# Patient Record
Sex: Male | Born: 1990 | Race: Black or African American | Hispanic: No | Marital: Single | Smoking: Light tobacco smoker
Health system: Southern US, Community
[De-identification: ages and names within clinical notes are randomized; demographics above are authoritative.]

## PROBLEM LIST (undated history)

## (undated) DIAGNOSIS — R011 Cardiac murmur, unspecified: Secondary | ICD-10-CM

## (undated) HISTORY — DX: Cardiac murmur, unspecified: R01.1

---
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
## 2013-01-08 ENCOUNTER — Emergency Department: Payer: Self-pay | Admitting: Emergency Medicine

## 2013-01-08 LAB — COMPREHENSIVE METABOLIC PANEL
Anion Gap: 7 (ref 7–16)
Bilirubin,Total: 0.3 mg/dL (ref 0.2–1.0)
Calcium, Total: 8.8 mg/dL (ref 8.5–10.1)
Co2: 27 mmol/L (ref 21–32)
Creatinine: 1.23 mg/dL (ref 0.60–1.30)
EGFR (African American): >60
EGFR (Non-African Amer.): >60
Osmolality: 281 (ref 275–301)
SGOT(AST): 27 U/L (ref 15–37)
Sodium: 141 mmol/L (ref 136–145)
Total Protein: 7.7 g/dL (ref 6.4–8.2)

## 2013-01-08 LAB — URINALYSIS, COMPLETE
Bilirubin,UR: NEGATIVE
Glucose,UR: NEGATIVE mg/dL (ref 0–75)
Ketone: NEGATIVE
Nitrite: NEGATIVE
RBC,UR: 1 /HPF (ref 0–5)
Squamous Epithelial: 1

## 2013-01-08 LAB — CBC
HCT: 39.1 % — ABNORMAL LOW (ref 40.0–52.0)
MCH: 25.6 pg — ABNORMAL LOW (ref 26.0–34.0)
MCHC: 33.2 g/dL (ref 32.0–36.0)
RBC: 5.08 10*6/uL (ref 4.40–5.90)
WBC: 8.2 10*3/uL (ref 3.8–10.6)

## 2013-01-10 LAB — URINE CULTURE

## 2017-05-03 ENCOUNTER — Emergency Department: Payer: Self-pay

## 2017-05-03 ENCOUNTER — Emergency Department
Admission: EM | Admit: 2017-05-03 | Discharge: 2017-05-03 | Disposition: A | Discharge status: Home / Self Care | Payer: Self-pay | Attending: Emergency Medicine | Admitting: Emergency Medicine

## 2017-05-03 DIAGNOSIS — Y998 Other external cause status: Secondary | ICD-10-CM | POA: Insufficient documentation

## 2017-05-03 DIAGNOSIS — Y33XXXA Other specified events, undetermined intent, initial encounter: Secondary | ICD-10-CM | POA: Insufficient documentation

## 2017-05-03 DIAGNOSIS — Y9302 Activity, running: Secondary | ICD-10-CM | POA: Insufficient documentation

## 2017-05-03 DIAGNOSIS — S92354A Nondisplaced fracture of fifth metatarsal bone, right foot, initial encounter for closed fracture: Secondary | ICD-10-CM | POA: Insufficient documentation

## 2017-05-03 DIAGNOSIS — Y929 Unspecified place or not applicable: Secondary | ICD-10-CM | POA: Insufficient documentation

## 2017-05-03 DIAGNOSIS — S92351A Displaced fracture of fifth metatarsal bone, right foot, initial encounter for closed fracture: Secondary | ICD-10-CM

## 2017-05-03 MED ORDER — HYDROCODONE-ACETAMINOPHEN 5-325 MG PO TABS: 1.0000 | ORAL_TABLET | Freq: Four times a day (QID) | ORAL | 0 refills | Status: DC | PRN

## 2017-05-03 NOTE — ED Triage Notes (Signed)
Pt states was jogging yesterday afternoon and felt a sharp stinging pain on the top outside of right foot. Pt state states hurts to put any pressure on right foot. Pt states iced foot and took ibuprofen but did not ease pain.

## 2017-05-03 NOTE — ED Provider Notes (Signed)
[facility] Emergency Department Provider Note   ____________________________________________   First MD Initiated Contact with Patient 05/03/17 1354     (approximate)  I have reviewed the triage vital signs and the nursing notes.   HISTORY  Chief Complaint Foot Pain   HPI Eric Ryan is a 26 y.o. male is complaining of right foot pain. Patient states that he was jogging yesterday afternoon and felt sharp pain on the outside portion of his right foot. Patient has iced his foot and take ibuprofen without much relief of pain. To his pain as 10 over 10.  History reviewed. No pertinent past medical history.  There are no active problems to display for this patient.   History reviewed. No pertinent surgical history.  Prior to Admission medications   Medication Sig Start Date End Date Taking? Authorizing Provider  HYDROcodone-acetaminophen (NORCO/VICODIN) 5-325 MG tablet Take 1 tablet by mouth every 6 (six) hours as needed for moderate pain. 05/03/17   Tommi Rumps, PA-C    Allergies Patient has no known allergies.  History reviewed. No pertinent family history.  Social History Social History  Substance Use Topics  . Smoking status: Never Smoker  . Smokeless tobacco: Never Used  . Alcohol use No    Review of Systems Constitutional: No fever/chills Cardiovascular: Denies chest pain. Respiratory: Denies shortness of breath. Gastrointestinal: No abdominal pain.  No nausea, no vomiting. Musculoskeletal: positive pain right foot. Skin: Negative for rash. Neurological: Negative for  focal weakness or numbness. ____________________________________________   PHYSICAL EXAM:  VITAL SIGNS: ED Triage Vitals  Enc Vitals Group     BP 05/03/17 1352 (!) 140/115     Pulse Rate 05/03/17 1352 (!) 105     Resp 05/03/17 1352 16     Temp 05/03/17 1352 99 F (37.2 C)     Temp Source 05/03/17 1352 Oral     SpO2 05/03/17 1352 98 %     Weight  05/03/17 1347 290 lb (131.5 kg)     Height 05/03/17 1347 6\' 2"  (1.88 m)     Head Circumference --      Peak Flow --      Pain Score 05/03/17 1346 10     Pain Loc --      Pain Edu? --      Excl. in GC? --    Constitutional: Alert and oriented. Well appearing and in no acute distress. Eyes: Conjunctivae are normal.  Head: Atraumatic. Neck: No stridor.   Cardiovascular: Normal rate, regular rhythm. Grossly normal heart sounds.  Good peripheral circulation. Respiratory: Normal respiratory effort.  No retractions. Lungs CTAB. Musculoskeletal: examination of the right foot there is moderate soft tissue swelling at the base of the  fifth metatarsal. Skin is intact. There is sensory function intact. Capillary refill is less than 3 seconds. No gross deformity is noted. Neurologic:  Normal speech and language. No gross focal neurologic deficits are appreciated.  Skin:  Skin is warm, dry and intact.  Psychiatric: Mood and affect are normal. Speech and behavior are normal.  ____________________________________________   LABS (all labs ordered are listed, but only abnormal results are displayed)  Labs Reviewed - No data to display   RADIOLOGY  Dg Foot Complete Right  Result Date: 05/03/2017 CLINICAL DATA:  Lateral foot pain after running yesterday. EXAM: RIGHT FOOT COMPLETE - 3+ VIEW COMPARISON:  None. FINDINGS: A nondisplaced transverse fracture is present at the base of the fifth metatarsal. Joints are located. No additional fractures  are present. IMPRESSION: Nondisplaced fracture the base of the fifth metatarsal. Electronically Signed   By: Marin Robertshristopher  Mattern M.D.   On: 05/03/2017 14:48    ____________________________________________   PROCEDURES  Procedure(s) performed: None  Procedures  Critical Care performed: No  ____________________________________________   INITIAL IMPRESSION / ASSESSMENT AND PLAN / ED COURSE  X-ray right foot confirm that patient has a fracture of  his fifth metatarsal base. Patient was given a wooden shoe, a prescription for Norco and ibuprofen and referral to Dr. Alberteen Spindleline who is the podiatrist on-call.   ____________________________________________   FINAL CLINICAL IMPRESSION(S) / ED DIAGNOSES  Final diagnoses:  Closed fracture of base of fifth metatarsal bone of right foot, initial encounter      NEW MEDICATIONS STARTED DURING THIS VISIT:  Discharge Medication List as of 05/03/2017  3:34 PM    START taking these medications   Details  HYDROcodone-acetaminophen (NORCO/VICODIN) 5-325 MG tablet Take 1 tablet by mouth every 6 (six) hours as needed for moderate pain., Starting Mon 05/03/2017, Print         Note:  This document was prepared using Dragon voice recognition software and may include unintentional dictation errors.    Tommi RumpsSummers, Rhonda L, PA-C 05/03/17 1548    Arnaldo NatalMalinda, Paul F, MD 05/15/17 [id number]

## 2017-05-03 NOTE — Discharge Instructions (Signed)
To follow-up with Dr. Alberteen Spindleline  who is [facility] and elevate as needed for swelling and pain. Begin taking Norco one every 6 hours as needed for pain. He may also continue taking ibuprofen 2 tablets 3 times a day with food. Wear  wooden shoe for support. These fractures generally takes between 4 and 6 weeks to heal. Also your  blood pressure was elevated today. Make an appointment with your primary care provider to have your blood pressure rechecked. Blood pressure today was 140/115.

## 2017-06-29 ENCOUNTER — Encounter: Payer: Self-pay | Admitting: Emergency Medicine

## 2017-06-29 ENCOUNTER — Emergency Department: Payer: Self-pay

## 2017-06-29 ENCOUNTER — Emergency Department
Admission: EM | Admit: 2017-06-29 | Discharge: 2017-06-29 | Disposition: A | Discharge status: Home / Self Care | Payer: Self-pay | Attending: Student in an Organized Health Care Education/Training Program | Admitting: Student in an Organized Health Care Education/Training Program

## 2017-06-29 ENCOUNTER — Other Ambulatory Visit: Payer: Self-pay

## 2017-06-29 DIAGNOSIS — A084 Viral intestinal infection, unspecified: Secondary | ICD-10-CM | POA: Insufficient documentation

## 2017-06-29 DIAGNOSIS — R509 Fever, unspecified: Secondary | ICD-10-CM | POA: Insufficient documentation

## 2017-06-29 DIAGNOSIS — F17200 Nicotine dependence, unspecified, uncomplicated: Secondary | ICD-10-CM | POA: Insufficient documentation

## 2017-06-29 NOTE — ED Provider Notes (Signed)
[facility] Emergency Department Provider Note  ____________________________________________  Time seen: Approximately 6:38 PM  I have reviewed the triage vital signs and the nursing notes.   HISTORY  Chief Complaint Fever and Cough    HPI Eric Ryan is a 26 y.o. male presents to the emergency department with no acute symptoms.  Patient reports that one day ago, he experienced 5 episodes of vomiting and diarrhea.  He denies abdominal pain, congestion, rhinorrhea, headache or myalgias.  Patient reports cough for the past 4 days and low-grade fever.  Patient reports that he needs a work note for yesterday.  He denies chest pain, chest tightness and current nausea.  Patient has no history of essential hypertension.   History reviewed. No pertinent past medical history.  There are no active problems to display for this patient.   History reviewed. No pertinent surgical history.  Prior to Admission medications   Medication Sig Start Date End Date Taking? Authorizing Provider  HYDROcodone-acetaminophen (NORCO/VICODIN) 5-325 MG tablet Take 1 tablet by mouth every 6 (six) hours as needed for moderate pain. 05/03/17   Tommi RumpsSummers, Rhonda L, PA-C    Allergies Patient has no known allergies.  No family history on file.  Social History Social History   Tobacco Use  . Smoking status: Light Tobacco Smoker  . Smokeless tobacco: Never Used  Substance Use Topics  . Alcohol use: No  . Drug use: No     Review of Systems  Constitutional: No fever/chills Eyes: No visual changes. No discharge ENT: No upper respiratory complaints. Cardiovascular: no chest pain. Respiratory: no cough. No SOB. Gastrointestinal: No abdominal pain.  No nausea, no vomiting.  No diarrhea.  No constipation. Musculoskeletal: Negative for musculoskeletal pain. Skin: Negative for rash, abrasions, lacerations, ecchymosis. Neurological: Negative for headaches, focal weakness or  numbness.   ____________________________________________   PHYSICAL EXAM:  VITAL SIGNS: ED Triage Vitals  Enc Vitals Group     BP 06/29/17 1646 (!) 183/107     Pulse Rate 06/29/17 1646 (!) 101     Resp 06/29/17 1646 16     Temp 06/29/17 1646 99.6 F (37.6 C)     Temp Source 06/29/17 1646 Oral     SpO2 06/29/17 1646 97 %     Weight 06/29/17 1643 285 lb (129.3 kg)     Height 06/29/17 1643 6\' 2"  (1.88 m)     Head Circumference --      Peak Flow --      Pain Score 06/29/17 1642 6     Pain Loc --      Pain Edu? --      Excl. in GC? --      Constitutional: Alert and oriented. Well appearing and in no acute distress. Eyes: Conjunctivae are normal. PERRL. EOMI. Head: Atraumatic. ENT:      Ears: TMs are pearly bilaterally.      Nose: No congestion/rhinnorhea.      Mouth/Throat: Mucous membranes are moist.  Neck: No stridor.  Full range of motion with no tenderness elicited with palpation of the C-spine.  Cardiovascular: Normal rate, regular rhythm. Normal S1 and S2.  Good peripheral circulation. Respiratory: Normal respiratory effort without tachypnea or retractions. Lungs CTAB. Good air entry to the bases with no decreased or absent breath sounds. Gastrointestinal: Bowel sounds 4 quadrants. Soft and nontender to palpation. No guarding or rigidity. No palpable masses. No distention. No CVA tenderness. Musculoskeletal: Full range of motion to all extremities. No gross deformities appreciated. Neurologic:  Normal speech and language. No gross focal neurologic deficits are appreciated.  Skin:  Skin is warm, dry and intact. No rash noted. Psychiatric: Mood and affect are normal. Speech and behavior are normal. Patient exhibits appropriate insight and judgement.   ____________________________________________   LABS (all labs ordered are listed, but only abnormal results are displayed)  Labs Reviewed - No data to  display ____________________________________________  EKG   ____________________________________________  RADIOLOGY Geraldo PitterI, Jaclyn M Woods, personally viewed and evaluated these images (plain radiographs) as part of my medical decision making, as well as reviewing the written report by the radiologist.  Dg Chest 2 View  Result Date: 06/29/2017 CLINICAL DATA:  Cough, fever. EXAM: CHEST  2 VIEW COMPARISON:  None. FINDINGS: The heart size and mediastinal contours are within normal limits. Both lungs are clear. No pneumothorax or pleural effusion is noted. The visualized skeletal structures are unremarkable. IMPRESSION: No active cardiopulmonary disease. Electronically Signed   By: Lupita RaiderJames  Green Jr, M.D.   On: 06/29/2017 18:15    ____________________________________________    PROCEDURES  Procedure(s) performed:    Procedures    Medications - No data to display   ____________________________________________   INITIAL IMPRESSION / ASSESSMENT AND PLAN / ED COURSE  Pertinent labs & imaging results that were available during my care of the patient were reviewed by me and considered in my medical decision making (see chart for details).  Review of the [place] CSRS was performed in accordance of the [facility] prior to dispensing any controlled drugs.     Assessment and plan Viral gastroenteritis Patient presents to the emergency department with low-grade fever, cough and vomiting.  Overall physical exam was completely reassuring.  DG chest revealed no consolidations or findings consistent with pneumonia.  Patient reports that he is not currently experiencing any symptoms and needs a note for work.  Vital signs are reassuring aside from hypertension.    ____________________________________________  FINAL CLINICAL IMPRESSION(S) / ED DIAGNOSES  Final diagnoses:  Viral gastroenteritis      NEW MEDICATIONS STARTED DURING THIS VISIT:  ED Discharge Orders    None          This  chart was dictated using voice recognition software/Dragon. Despite best efforts to proofread, errors can occur which can change the meaning. Any change was purely unintentional.    Orvil FeilWoods, Jaclyn M, PA-C 06/29/17 1851    Willy Eddyobinson, Patrick, MD 07/03/17 [phone]

## 2017-06-29 NOTE — ED Notes (Addendum)
Pt reports that he awakened Sunday morning with n/v/chills. States he has been taking tylenol and pepto bismol. He reports fever, but doesn't know how high it got. Pt states it is actually somewhat better today but wanted to have it checked out. Pt alert & oriented with NAD noted. Last time vomiting was this morning.

## 2017-06-29 NOTE — ED Triage Notes (Signed)
Fever and cough x 4 days.  Last medicated for fever this morning.

## 2019-04-20 IMAGING — CR DG CHEST 2V
1 series · 2 of 2 positions shown · non-contrast
Comparison: None.

CLINICAL DATA: Cough, fever.

EXAM:
CHEST  2 VIEW

[Series 1: dg chest 2 view · 0.14mm/px · 2 of 2 slices shown]
[im 1/2]
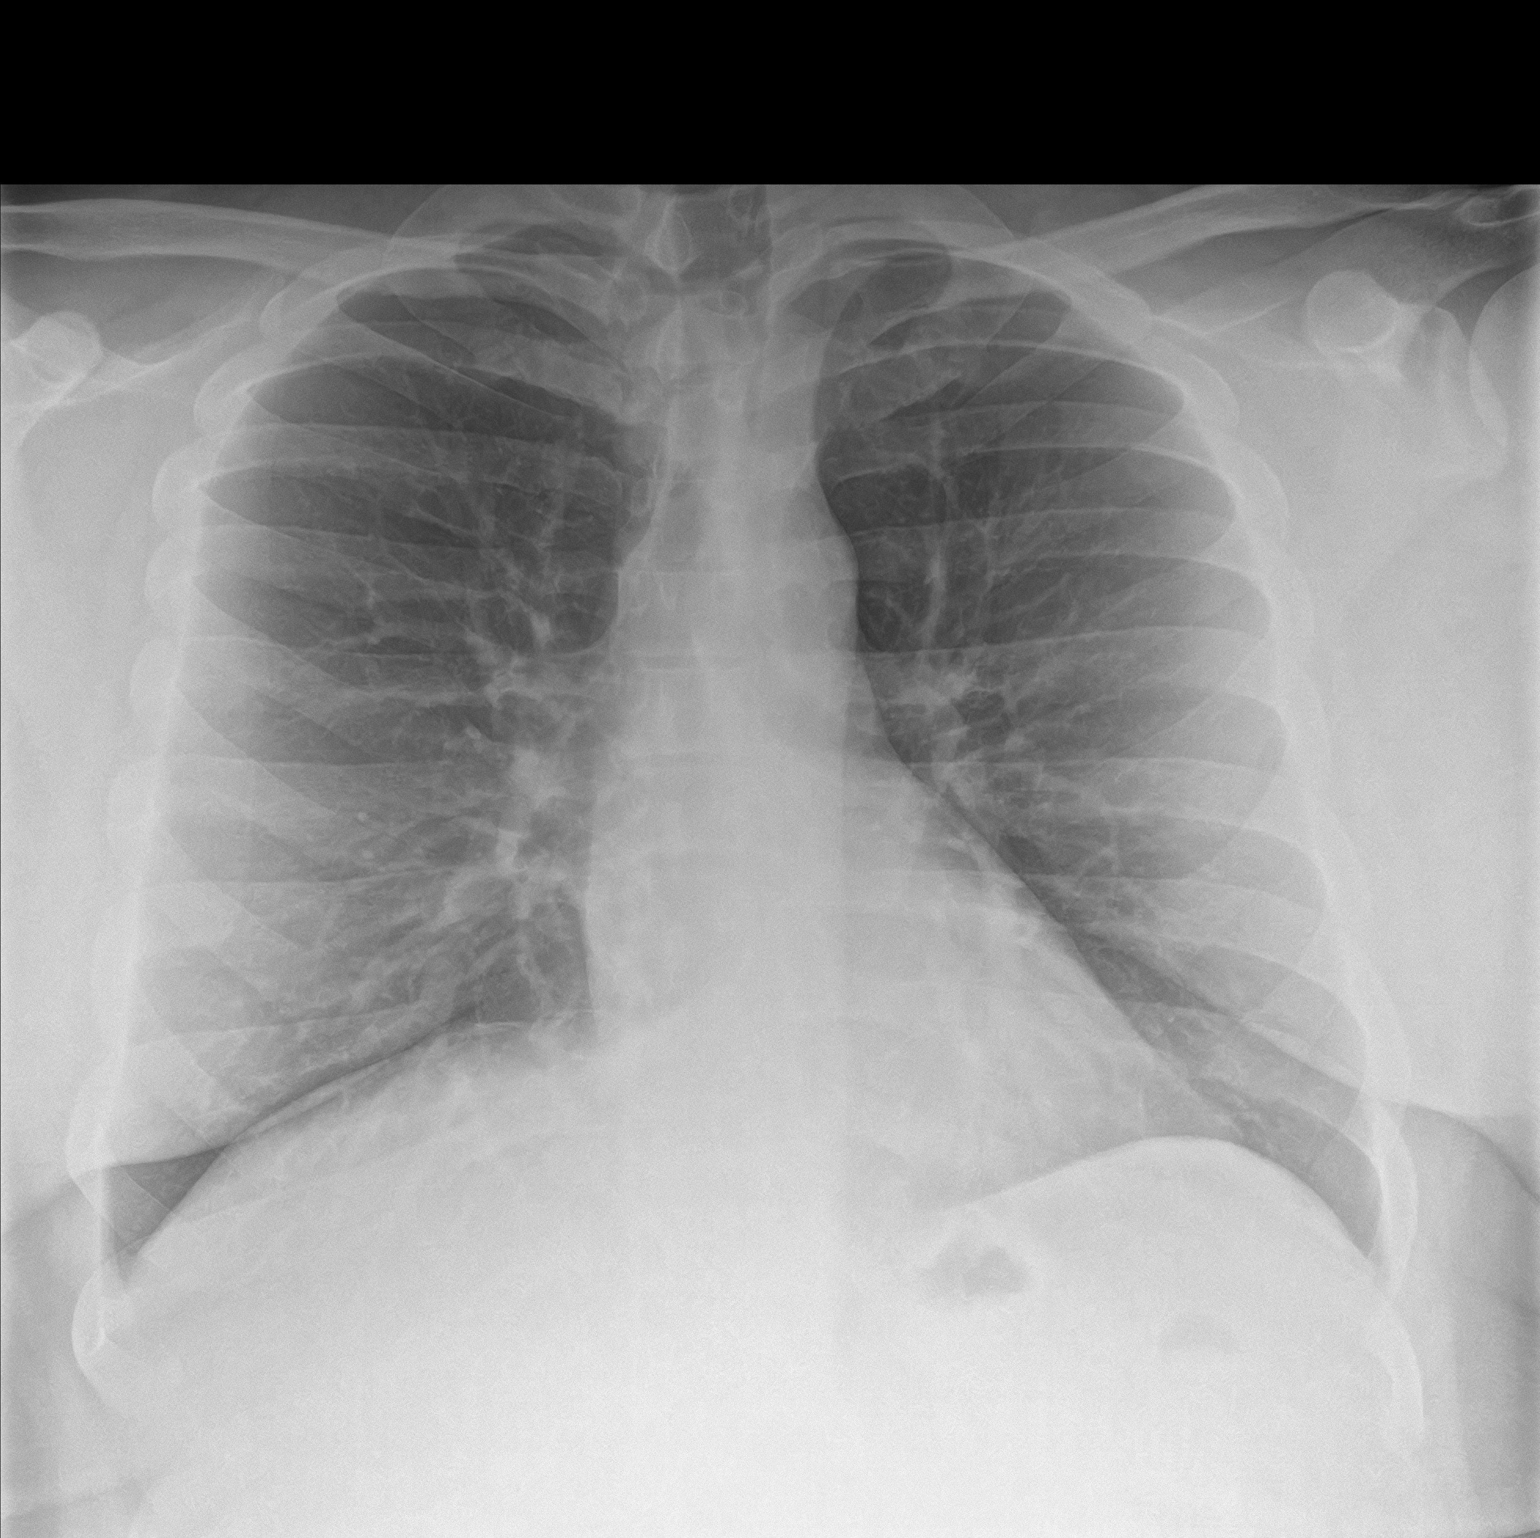
[im 2/2]
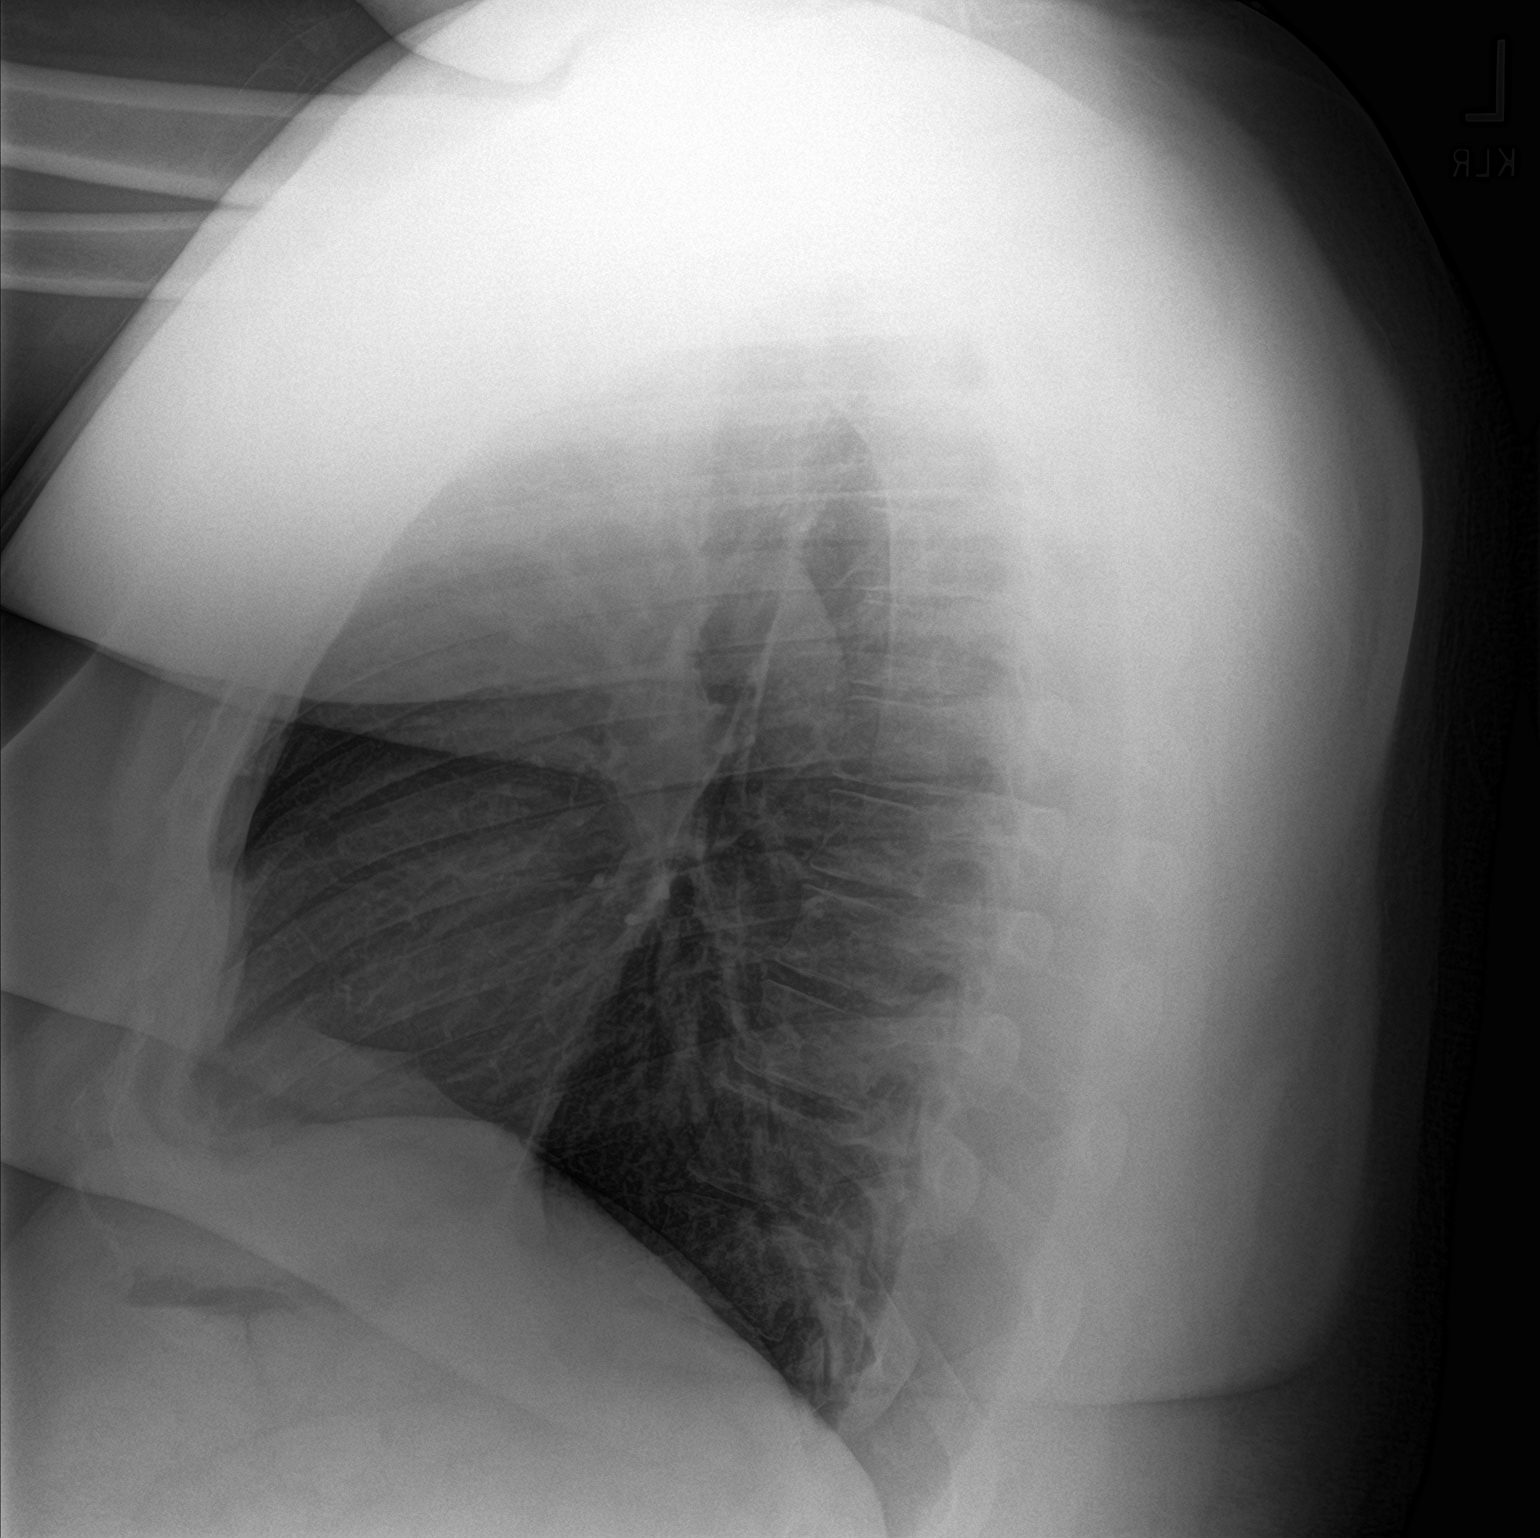

[2 of 2 positions shown; findings below may reference images not displayed]

FINDINGS: The heart size and mediastinal contours are within normal limits.
Both lungs are clear. No pneumothorax or pleural effusion is noted.
The visualized skeletal structures are unremarkable.
IMPRESSION: No active cardiopulmonary disease.

## 2020-12-18 ENCOUNTER — Other Ambulatory Visit: Payer: Self-pay

## 2020-12-18 ENCOUNTER — Ambulatory Visit: Payer: Self-pay | Admitting: Physician Assistant

## 2020-12-18 DIAGNOSIS — Z113 Encounter for screening for infections with a predominantly sexual mode of transmission: Secondary | ICD-10-CM

## 2020-12-18 DIAGNOSIS — Z299 Encounter for prophylactic measures, unspecified: Secondary | ICD-10-CM

## 2020-12-18 LAB — GRAM STAIN

## 2020-12-18 MED ORDER — METRONIDAZOLE 500 MG PO TABS: 2000.0000 mg | ORAL_TABLET | Freq: Once | ORAL | 0 refills | Status: AC

## 2020-12-18 MED ORDER — DOXYCYCLINE HYCLATE 100 MG PO TABS: 100.0000 mg | ORAL_TABLET | Freq: Two times a day (BID) | ORAL | 0 refills | Status: AC

## 2020-12-18 NOTE — Progress Notes (Signed)
Gram stain results reviewed with pt by provider, pt treated per provider orders. Provider orders completed.

## 2020-12-20 ENCOUNTER — Encounter: Payer: Self-pay | Admitting: Physician Assistant

## 2020-12-20 NOTE — Progress Notes (Signed)
[facility] Department STI clinic/screening visit  Subjective:  Eric Ryan is a 30 y.o. male being seen today for an STI screening visit. The patient reports they do have symptoms.    Patient has the following medical conditions:  There are no problems to display for this patient.    Chief Complaint  Patient presents with   SEXUALLY TRANSMITTED DISEASE    screening    HPI  Patient reports that he has had clear discharge and itching/tingling feeling inside his urethra since 12/14/2020.  Denies other symptoms, chronic conditions, surgeries and regular medicines.  Reports last HIV test was 2 years ago and last void prior to sample collection for Gram stain was over 2 hr ago.    See flowsheet for further details and programmatic requirements.    The following portions of the patient's history were reviewed and updated as appropriate: allergies, current medications, past medical history, past social history, past surgical history and problem list.  Objective:  There were no vitals filed for this visit.  Physical Exam Constitutional:      General: He is not in acute distress.    Appearance: Normal appearance.  HENT:     Head: Normocephalic and atraumatic.     Comments: No nits,lice, or hair loss. No cervical, supraclavicular or axillary adenopathy.     Mouth/Throat:     Mouth: Mucous membranes are moist.     Pharynx: Oropharynx is clear. No oropharyngeal exudate or posterior oropharyngeal erythema.  Eyes:     Conjunctiva/sclera: Conjunctivae normal.  Pulmonary:     Effort: Pulmonary effort is normal.  Abdominal:     Palpations: Abdomen is soft. There is no mass.     Tenderness: There is no abdominal tenderness. There is no guarding or rebound.  Genitourinary:    Penis: Normal.      Testes: Normal.     Comments: Pubic area without nits, lice, hair loss, edema, erythema, lesions and inguinal adenopathy. Penis uncircumcised without rash, lesions and  discharge at meatus. Testicles descended bilaterally,nt, no masses or edema.  Musculoskeletal:     Cervical back: Neck supple. No tenderness.  Skin:    General: Skin is warm and dry.     Findings: No bruising, erythema, lesion or rash.  Neurological:     Mental Status: He is alert and oriented to person, place, and time.  Psychiatric:        Mood and Affect: Mood normal.        Behavior: Behavior normal.        Thought Content: Thought content normal.        Judgment: Judgment normal.      Assessment and Plan:  Eric Ryan is a 30 y.o. male presenting to the [facility] Department for STI screening  1. Screening for STD (sexually transmitted disease) Patient into clinic with symptoms. Reviewed Gram stain results and counseled patient about options.  Rec condoms with all sex. Await test results.  Counseled that RN will call if needs to RTC for treatment once results are back.  - Gram stain - Gonococcus culture - HIV [place] [facility] - Syphilis Serology, [facility]  2. Prophylactic measure Patient opts to be treated while awaiting other results. Treat with Doxycycline 100 mg #14 1 po BID for 7 days and Metronidazole 2 g po with food, no EtOH for 24 hr before and until 72 hr after completing medicine. No sex for 10  days and until after partner  has completed treatment. - metroNIDAZOLE (FLAGYL) 500 MG tablet; Take 4 tablets (2,000 mg total) by mouth once for 1 dose.  Dispense: 4 tablet; Refill: 0 - doxycycline (VIBRA-TABS) 100 MG tablet; Take 1 tablet (100 mg total) by mouth 2 (two) times daily for 7 days.  Dispense: 14 tablet; Refill: 0     No follow-ups on file.  No future appointments.  Matt Holmes, PA

## 2020-12-22 LAB — GONOCOCCUS CULTURE

## 2023-02-12 ENCOUNTER — Other Ambulatory Visit: Payer: Self-pay

## 2023-02-12 DIAGNOSIS — Z021 Encounter for pre-employment examination: Secondary | ICD-10-CM

## 2023-02-12 NOTE — Progress Notes (Signed)
Pt presents today for pre-employment UDS. CLEARED, HR NOTIFIED.

## 2023-02-12 NOTE — Progress Notes (Signed)
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  [employer] Acct #:  [id number] [employer] Specimen #:  [id number]  Rapid drug screen results = Negative  AMD

## 2023-03-05 ENCOUNTER — Ambulatory Visit: Payer: Self-pay

## 2023-03-05 DIAGNOSIS — Z0110 Encounter for examination of ears and hearing without abnormal findings: Secondary | ICD-10-CM

## 2023-03-05 NOTE — Progress Notes (Signed)
Presents to [facility] for on-site baseline hearing screen.  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  [facility] department is part of [employer] [facility].  [place] usure of Tdap status.  Declines updating today.  States he will consider doing it at his physical if he chooses to use [employer] clinic for his annual physical.  AMD

## 2023-07-06 ENCOUNTER — Encounter: Payer: Self-pay | Admitting: Physician Assistant

## 2023-07-06 ENCOUNTER — Ambulatory Visit: Payer: Self-pay | Admitting: Physician Assistant

## 2023-07-06 VITALS — BP 128/80 | HR 97 | Temp 98.0°F | Resp 14 | Ht 74.0 in | Wt >= 6400 oz

## 2023-07-06 DIAGNOSIS — S76211A Strain of adductor muscle, fascia and tendon of right thigh, initial encounter: Secondary | ICD-10-CM

## 2023-07-06 NOTE — Progress Notes (Signed)
   Subjective: Left groin pain    Patient ID: Eric Ryan, male    DOB: May 22, 1991, 32 y.o.   MRN: [id number]  HPI Patient complains of 3 days of left groin pain.  States pain decreases with getting in and out of vehicles.  Also notes increased pain with bending and squatting motions.  Denies urinary discomfort.  States no palpable lesion in the left groin area.   Review of Systems Negative except for above complaint    Objective:   Physical Exam BP 128/80  Pulse Rate 97  Temp 98 F (36.7 C)  Weight 415 lb (188.2 kg)Weight. 415 lb (188.2 kg). Data is abnormal. Taken on 07/06/23 8:49 AM  Height 6' 2 (1.88 m)  Resp 14  SpO2 97 %  No acute distress.  Patient ambulates with atypical gait.  Hesitancy upon climbing onto exam the exam table. Exam is limited by body habitus.  No palpable lesion but moderate guarding of the left inguinal area.       Assessment & Plan: Left inguinal strain  Patient given heating pads and over-the-counter naproxen.  Patient placed on restricted work duty for the next 3 days.  Return back to clinic condition worsens or no improvement.

## 2023-07-06 NOTE — Progress Notes (Signed)
 Stated pain in left groin worsening this day.  Denies any injury and unknown cause.  Denies pain at rest, but with getting in and out of vehicle it's pain level approximately 8.  Able to ambulate unassisted and sit on exam table.  Reports no ortho problems in the past and does not follow with any provider.  Due to schedule yearly physical soon and set up with [facility] provider as new to [employer].

## 2023-08-12 ENCOUNTER — Encounter: Payer: Self-pay | Admitting: Family Medicine

## 2023-08-12 ENCOUNTER — Ambulatory Visit: Payer: 59 | Admitting: Family Medicine

## 2023-08-12 VITALS — BP 125/93 | HR 87 | Ht 74.0 in | Wt >= 6400 oz

## 2023-08-12 DIAGNOSIS — Z6841 Body Mass Index (BMI) 40.0 and over, adult: Secondary | ICD-10-CM

## 2023-08-12 DIAGNOSIS — S76212A Strain of adductor muscle, fascia and tendon of left thigh, initial encounter: Secondary | ICD-10-CM | POA: Diagnosis not present

## 2023-08-12 DIAGNOSIS — F17200 Nicotine dependence, unspecified, uncomplicated: Secondary | ICD-10-CM

## 2023-08-12 DIAGNOSIS — E66813 Obesity, class 3: Secondary | ICD-10-CM | POA: Insufficient documentation

## 2023-08-12 DIAGNOSIS — Z1159 Encounter for screening for other viral diseases: Secondary | ICD-10-CM

## 2023-08-12 DIAGNOSIS — R030 Elevated blood-pressure reading, without diagnosis of hypertension: Secondary | ICD-10-CM | POA: Diagnosis not present

## 2023-08-12 DIAGNOSIS — Z114 Encounter for screening for human immunodeficiency virus [HIV]: Secondary | ICD-10-CM

## 2023-08-12 DIAGNOSIS — Z7689 Persons encountering health services in other specified circumstances: Secondary | ICD-10-CM | POA: Insufficient documentation

## 2023-08-12 DIAGNOSIS — Z130 Encounter for screening for diseases of the blood and blood-forming organs and certain disorders involving the immune mechanism: Secondary | ICD-10-CM

## 2023-08-12 NOTE — Assessment & Plan Note (Signed)
 Routine labs Concerns today - left groin strain Elevated BP - no hx of HTN - lifestyle changes today F/u 2 months for BP check

## 2023-08-12 NOTE — Assessment & Plan Note (Addendum)
 Appears to have strained L groin muscle after nearing doing the splits on the stairs a few days ago.  Pain only present with extension and going to sit. Otherwise pt denies any pain. States he is not in pain while sitting for visit.  Described as a pulling sensation.   No associated urinary or bowel changes. No swelling or bruising. -Continue Tylenol  as needed for pain. -May use Aleve BID for two weeks to reduce inflammation. -Advise rest, heat, and ice application. - topical bengay or tiger balm -Consider physical therapy or imaging if pain persists after one month - expressed muscle strain can take time to heal. -if symptoms persist or worsen, f/u up sooner

## 2023-08-12 NOTE — Assessment & Plan Note (Signed)
 Smokes one Black and Mild - weekly Pt agreeable to cutting back to 1-2 per month

## 2023-08-12 NOTE — Assessment & Plan Note (Addendum)
 Blood pressure consistently elevated (149/105 today; recheck 125/93).  No current antihypertensive medication or known hx of HTN.  Improved with recheck, concerning though for undiagnosed HTN. Discussed medication options and lifestyle changes. Shared decision making - lifestyle changes for two months. Denies headaches, vision changes, urination issues, chest pain, swelling. -Advise patient to purchase an upper arm blood pressure cuff and monitor blood pressure three times a week. -Goal<120/80 -Encourage lifestyle modifications including increased water intake, decreased caffeine and salt intake, weight loss, and regular exercise. -Plan to reassess in 2 months and will start antihypertensive medication if blood pressure remains elevated. Cmp check today

## 2023-08-12 NOTE — Progress Notes (Signed)
 New Patient Office Visit  Introduced to nurse practitioner role and practice setting.  All questions answered.  Discussed provider/patient relationship and expectations.   Subjective    Patient ID: Eric Ryan, male    DOB: February 16, 1991  Age: 33 y.o. MRN: [id number]  CC:  Chief Complaint  Patient presents with   New Patient (Initial Visit)    HPI Eric Ryan [facility]) presents to establish care.   No past medical hx - wants to have primary care provider  Only concern today is L groin strain: He recently sustained an injury to his left groin area. About a month ago, he felt a pull in his groin after taking a leap on the steps. He was prescribed ibuprofen and a muscle relaxant, which initially helped. Recently, he reinjured the area by doing a split on the stairs, resulting in stiffness and pain, especially when standing up or sitting down. He describes the pain as feeling like a 'jam in your finger' and rates it as an 8/10 during movement, which subsides immediately. He has been taking Tylenol  for pain relief. No bruising, swelling, or signs of a hernia. No changes in bowel movements, urination issues, or symptoms suggestive of a hernia.  High BP - He is morbidly obese with a BMI of 53.32 and has elevated blood pressure readings, with a recent measurement of 149/105. He does not regularly monitor his blood pressure. He wants to improve his physical activity and diet, noting that his recent injury has limited his ability to exercise. He typically walks around his block for exercise but has been resting more since the injury.  He has a history of a heart murmur when he was a baby, but no other significant past medical history or surgeries. He does not take any daily medications.  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 He has a 49 year old son and an 27 year old daughter. He is not currently in a relationship and  has no pets. He occasionally uses tobacco, specifically black and mild cigars, and drinks alcohol infrequently, preferring cocktails. He denies any other substance use.  No outpatient encounter medications on file as of 08/12/2023.   No facility-administered encounter medications on file as of 08/12/2023.    Past Medical History:  Diagnosis Date   Heart murmur     History reviewed. No pertinent surgical history.  Family History  Problem Relation Age of Onset   Congestive Heart Failure Mother     Social History   Socioeconomic History   Marital status: Single    Spouse name: Not on file   Number of children: Not on file   Years of education: Not on file   Highest education level: Not on file  Occupational History   Not on file  Tobacco Use   Smoking status: Light Smoker   Smokeless tobacco: Never  Vaping Use   Vaping status: Never Used  Substance and Sexual Activity   Alcohol use: Yes    Alcohol/week: 1.0 standard drink of alcohol    Types: 1 Standard drinks or equivalent per week    Comment: 3x cocktails per month   Drug use: Not Currently    Types: Marijuana   Sexual activity: Yes    Partners: Female  Other Topics Concern   Not on file  Social History Narrative   Not on file   Social Drivers of Health   Financial Resource Strain: Not on file  Food Insecurity:  Not on file  Transportation Needs: Not on file  Physical Activity: Not on file  Stress: Not on file  Social Connections: Not on file  Intimate Partner Violence: Not on file   Review of Systems  All other systems reviewed and are negative.     Objective    BP (!) 125/93 (BP Location: Left Arm, Patient Position: Sitting, Cuff Size: Large)   Pulse 87   Ht 6' 2 (1.88 m)   Wt (!) 415 lb 4.8 oz (188.4 kg)   SpO2 98%   BMI 53.32 kg/m   Physical Exam Constitutional:      General: He is not in acute distress.    Appearance: Normal appearance. He is obese. He is not ill-appearing, toxic-appearing  or diaphoretic.  HENT:     Head: Normocephalic.     Nose: Nose normal.     Mouth/Throat:     Mouth: Mucous membranes are moist.  Eyes:     Extraocular Movements: Extraocular movements intact.     Conjunctiva/sclera: Conjunctivae normal.     Pupils: Pupils are equal, round, and reactive to light.  Cardiovascular:     Rate and Rhythm: Normal rate and regular rhythm.     Heart sounds: No murmur heard.    No friction rub. No gallop.  Pulmonary:     Effort: Pulmonary effort is normal. No respiratory distress.     Breath sounds: Normal breath sounds. No stridor. No wheezing, rhonchi or rales.  Chest:     Chest wall: No tenderness.  Genitourinary:    Comments: Shared decision making, pt denies issues with genital, testicles, does not feel bulge or issues with urination/ bowel movements. Nothing feels swollen. Declined GU assessment during visit. Musculoskeletal:     Left upper leg: Tenderness present.     Right lower leg: No edema.     Left lower leg: No edema.     Comments: Tenderness with leg extension in groin area. No pain when not moving, no swelling, pulses palpable, no sensation changes. No tenderness to touch.   Skin:    General: Skin is warm and dry.     Capillary Refill: Capillary refill takes less than 2 seconds.  Neurological:     General: No focal deficit present.     Mental Status: He is alert and oriented to person, place, and time. Mental status is at baseline.     Cranial Nerves: No cranial nerve deficit.     Sensory: No sensory deficit.     Motor: No weakness.     Coordination: Coordination normal.     Gait: Gait normal.         Assessment & Plan:   Elevated blood pressure reading in office without diagnosis of hypertension Assessment & Plan: Blood pressure consistently elevated (149/105 today; recheck 125/93).  No current antihypertensive medication or known hx of HTN.  Improved with recheck, concerning though for undiagnosed HTN. Discussed medication  options and lifestyle changes. Shared decision making - lifestyle changes for two months. Denies headaches, vision changes, urination issues, chest pain, swelling. -Advise patient to purchase an upper arm blood pressure cuff and monitor blood pressure three times a week. -Goal<120/80 -Encourage lifestyle modifications including increased water intake, decreased caffeine and salt intake, weight loss, and regular exercise. -Plan to reassess in 2 months and will start antihypertensive medication if blood pressure remains elevated. Cmp check today  Orders: -     Comprehensive metabolic panel  Class 3 severe obesity without serious comorbidity with body  mass index (BMI) of 50.0 to 59.9 in adult, unspecified obesity type [facility]) Assessment & Plan: Appears chronic per other pt visits at other clinics Recommend exercising 150 minutes per week Weight loss Well balanced diet Increase protein, fruits, veggies, decrease starches, processed foods, and saturated fats.  Pt plans to start exercising once groin is healed Will check lipid, CMP, A1C  Orders: -     TSH -     Hemoglobin A1c -     Lipid panel  Screening for deficiency anemia -     CBC  Encounter for hepatitis C screening test for low risk patient -     Hepatitis C antibody  Encounter for screening for HIV -     HIV Antibody (routine testing w rflx)  Encounter to establish care with new doctor Assessment & Plan: Routine labs Concerns today - left groin strain Elevated BP - no hx of HTN - lifestyle changes today F/u 2 months for BP check   Strain of left groin Assessment & Plan: Appears to have strained L groin muscle after nearing doing the splits on the stairs a few days ago.  Pain only present with extension and going to sit. Otherwise pt denies any pain. States he is not in pain while sitting for visit.  Described as a pulling sensation.   No associated urinary or bowel changes. No swelling or bruising. -Continue Tylenol   as needed for pain. -May use Aleve BID for two weeks to reduce inflammation. -Advise rest, heat, and ice application. - topical bengay or tiger balm -Consider physical therapy or imaging if pain persists after one month - expressed muscle strain can take time to heal. -if symptoms persist or worsen, f/u up sooner   Tobacco use disorder, mild, abuse Assessment & Plan: Smokes one Black and Mild - weekly Pt agreeable to cutting back to 1-2 per month   Will communicate labs  Return in about 2 months (around 10/10/2023) for bp check post lifestyle.   I, [name], FNP, have reviewed all documentation for this visit. The documentation on 08/12/23 for the exam, diagnosis, procedures, and orders are all accurate and complete.   [name], FNP

## 2023-08-12 NOTE — Assessment & Plan Note (Signed)
 Appears chronic per other pt visits at other clinics Recommend exercising 150 minutes per week Weight loss Well balanced diet Increase protein, fruits, veggies, decrease starches, processed foods, and saturated fats.  Pt plans to start exercising once groin is healed Will check lipid, CMP, A1C

## 2023-08-13 ENCOUNTER — Encounter: Payer: Self-pay | Admitting: Family Medicine

## 2023-08-13 LAB — HEMOGLOBIN A1C
Est. average glucose Bld gHb Est-mCnc: 123 mg/dL
Hgb A1c MFr Bld: 5.9 % — ABNORMAL HIGH (ref 4.8–5.6)

## 2023-08-13 LAB — COMPREHENSIVE METABOLIC PANEL
ALT: 14 [IU]/L (ref 0–44)
AST: 12 [IU]/L (ref 0–40)
Albumin: 4.4 g/dL (ref 4.1–5.1)
Alkaline Phosphatase: 67 [IU]/L (ref 44–121)
BUN/Creatinine Ratio: 12 (ref 9–20)
BUN: 12 mg/dL (ref 6–20)
Bilirubin Total: 0.4 mg/dL (ref 0.0–1.2)
CO2: 21 mmol/L (ref 20–29)
Calcium: 9.6 mg/dL (ref 8.7–10.2)
Chloride: 103 mmol/L (ref 96–106)
Creatinine, Ser: 1.03 mg/dL (ref 0.76–1.27)
Globulin, Total: 3.1 g/dL (ref 1.5–4.5)
Glucose: 96 mg/dL (ref 70–99)
Potassium: 4.6 mmol/L (ref 3.5–5.2)
Sodium: 139 mmol/L (ref 134–144)
Total Protein: 7.5 g/dL (ref 6.0–8.5)
eGFR: 98 mL/min/{1.73_m2} (ref >59)

## 2023-08-13 LAB — LIPID PANEL
Chol/HDL Ratio: 5.7 {ratio} — ABNORMAL HIGH (ref 0.0–5.0)
Cholesterol, Total: 181 mg/dL (ref 100–199)
HDL: 32 mg/dL — ABNORMAL LOW (ref >39)
LDL Chol Calc (NIH): 132 mg/dL — ABNORMAL HIGH (ref 0–99)
Triglycerides: 91 mg/dL (ref 0–149)
VLDL Cholesterol Cal: 17 mg/dL (ref 5–40)

## 2023-08-13 LAB — HIV ANTIBODY (ROUTINE TESTING W REFLEX): HIV Screen 4th Generation wRfx: NONREACTIVE

## 2023-08-13 LAB — CBC
Hematocrit: 45 % (ref 37.5–51.0)
Hemoglobin: 14.2 g/dL (ref 13.0–17.7)
MCH: 25.4 pg — ABNORMAL LOW (ref 26.6–33.0)
MCHC: 31.6 g/dL (ref 31.5–35.7)
MCV: 81 fL (ref 79–97)
Platelets: 402 10*3/uL (ref 150–450)
RBC: 5.59 x10E6/uL (ref 4.14–5.80)
RDW: 14.7 % (ref 11.6–15.4)
WBC: 7.5 10*3/uL (ref 3.4–10.8)

## 2023-08-13 LAB — TSH: TSH: 0.847 u[IU]/mL (ref 0.450–4.500)

## 2023-08-13 LAB — HEPATITIS C ANTIBODY: Hep C Virus Ab: NONREACTIVE

## 2023-08-16 ENCOUNTER — Encounter: Payer: Self-pay | Admitting: Family Medicine

## 2023-08-16 ENCOUNTER — Other Ambulatory Visit: Payer: Self-pay | Admitting: Family Medicine

## 2023-08-16 DIAGNOSIS — S76212A Strain of adductor muscle, fascia and tendon of left thigh, initial encounter: Secondary | ICD-10-CM

## 2023-08-16 MED ORDER — MELOXICAM 7.5 MG PO TABS: 7.5000 mg | ORAL_TABLET | Freq: Every day | ORAL | 0 refills | Status: DC

## 2023-10-12 ENCOUNTER — Ambulatory Visit: Payer: 59 | Admitting: Family Medicine

## 2023-10-12 NOTE — Progress Notes (Deleted)
      Established patient visit   Patient: Eric Ryan   DOB: 05/28/1991   33 y.o. Male  MRN: [id number] Visit Date: 10/12/2023  Today's healthcare provider: Ronnald Ramp, MD   No chief complaint on file.  Subjective       Discussed the use of AI scribe software for clinical note transcription with the patient, who gave verbal consent to proceed.  History of Present Illness      Past Medical History:  Diagnosis Date  . Heart murmur     Medications: Outpatient Medications Prior to Visit  Medication Sig  . meloxicam (MOBIC) 7.5 MG tablet Take 1 tablet (7.5 mg total) by mouth daily.   No facility-administered medications prior to visit.    Review of Systems  {Insert previous labs (optional):23779} {See past labs  Heme  Chem  Endocrine  Serology  Results Review (optional):1}   Objective    There were no vitals taken for this visit. {Insert last BP/Wt (optional):23777}{See vitals history (optional):1}    Physical Exam  ***  No results found for any visits on 10/12/23.  Assessment & Plan     Problem List Items Addressed This Visit   None   Assessment and Plan Assessment & Plan      No follow-ups on file.         Ronnald Ramp, MD  [facility] [phone] (phone) [phone] (fax)  [facility]

## 2023-10-13 ENCOUNTER — Encounter: Payer: Self-pay | Admitting: Family Medicine

## 2023-11-10 ENCOUNTER — Emergency Department: Payer: MEDICAID

## 2023-11-10 ENCOUNTER — Emergency Department
Admission: EM | Admit: 2023-11-10 | Discharge: 2023-11-10 | Disposition: A | Discharge status: Home / Self Care | Payer: MEDICAID | Attending: Emergency Medicine | Admitting: Emergency Medicine

## 2023-11-10 ENCOUNTER — Other Ambulatory Visit: Payer: Self-pay

## 2023-11-10 DIAGNOSIS — M879 Osteonecrosis, unspecified: Secondary | ICD-10-CM | POA: Insufficient documentation

## 2023-11-10 DIAGNOSIS — M8700 Idiopathic aseptic necrosis of unspecified bone: Secondary | ICD-10-CM

## 2023-11-10 DIAGNOSIS — M25552 Pain in left hip: Secondary | ICD-10-CM

## 2023-11-10 DIAGNOSIS — M79652 Pain in left thigh: Secondary | ICD-10-CM | POA: Diagnosis present

## 2023-11-10 MED ORDER — TRAMADOL HCL 50 MG PO TABS: 50.0000 mg | ORAL_TABLET | Freq: Four times a day (QID) | ORAL | 0 refills | Status: DC | PRN

## 2023-11-10 NOTE — Discharge Instructions (Signed)
 Follow-up with [facility] clinic orthopedics.  Please call them for an appointment.  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  Tylenol  and meloxicam  as needed for pain.  Apply ice to the left hip. Return emergency department if worsening

## 2023-11-10 NOTE — ED Triage Notes (Signed)
 Pt states L leg pain near inner thigh area, pt states he heard something "pop". NAD noted, Pt ambulatory.

## 2023-11-10 NOTE — ED Provider Notes (Signed)
 [facility] Provider Note    Event Date/Time   First MD Initiated Contact with Patient 11/10/23 1111     (approximate)   History   Leg Pain   HPI  Eric Ryan is a 33 y.o. male history of a heart murmur, previous left inner thigh pain being treated with meloxicam  and prednisone back in February.  [facility] was [facility].  Realized today does not have health insurance so decided come to the ED for the same pain that feels like it is worsening.  Patient states he felt a pop the other day and feels like his hip is grinding.  Worse with sitting.  No numbness tingling.  No redness swelling.  No bruising.      Physical Exam   Triage Vital Signs: ED Triage Vitals  Encounter Vitals Group     BP 11/10/23 1046 (!) 138/102     Systolic BP Percentile --      Diastolic BP Percentile --      Pulse Rate 11/10/23 1046 83     Resp 11/10/23 1046 19     Temp 11/10/23 1046 98.8 F (37.1 C)     Temp Source 11/10/23 1046 Oral     SpO2 11/10/23 1046 95 %     Weight --      Height --      Head Circumference --      Peak Flow --      Pain Score 11/10/23 1053 10     Pain Loc --      Pain Education --      Exclude from Growth Chart --     Most recent vital signs: Vitals:   11/10/23 1046  BP: (!) 138/102  Pulse: 83  Resp: 19  Temp: 98.8 F (37.1 C)  SpO2: 95%     General: Awake, no distress.   CV:  Good peripheral perfusion.  Resp:  Normal effort.  Abd:  No distention.   Other:  Left hip tender along the inguinal area, no hernia noted, pain is reproduced with range of motion, neurovascular is intact,  no bruising or redness noted   ED Results / Procedures / Treatments   Labs (all labs ordered are listed, but only abnormal results are displayed) Labs Reviewed - No data to display   EKG     RADIOLOGY X-ray of the left hip    PROCEDURES:   Procedures Chief Complaint  Patient presents with   Leg Pain       MEDICATIONS ORDERED IN ED: Medications - No data to display   IMPRESSION / MDM / ASSESSMENT AND PLAN / ED COURSE  I reviewed the triage vital signs and the nursing notes.                              Differential diagnosis includes, but is not limited to, labrum tear, inguinal strain, fracture, arthritis  Patient's presentation is most consistent with acute illness / injury with system symptoms.   X-ray of the left hip, independently reviewed interpreted by me as having possible AVN, confirmed by radiology  MRI of the left hip, did independently review the images.  Did independently review and interpret the radiologist reading as being positive for avascular necrosis with possible fracture.  Consult to orthopedics.  Dr. Daun Epstein states place patient on crutches, toe-touch only, follow-up with orthopedics.  Surgery not emergent for today.  I  did explain all the findings to the patient.  Also encouraged patient access to help him with Medicaid signed up.  Patient was given a prescription for tramadol for pain.  He can take Tylenol  and ibuprofen.  Do not want to give him any more steroids secondary to the AVN.  He is in agreement treatment plan at this time.  Discharged stable condition.      FINAL CLINICAL IMPRESSION(S) / ED DIAGNOSES   Final diagnoses:  AVN (avascular necrosis of bone) (HCC)  Acute hip pain, left     Rx / DC Orders   ED Discharge Orders          Ordered    traMADol (ULTRAM) 50 MG tablet  Every 6 hours PRN        11/10/23 1742             Note:  This document was prepared using Dragon voice recognition software and may include unintentional dictation errors.    Delsie Figures, PA-C 11/10/23 1747    Claria Crofts, MD 11/10/23 [phone]

## 2023-11-10 NOTE — ED Notes (Signed)
 Pt declines crutches as he already has some at home

## 2023-12-09 ENCOUNTER — Encounter: Payer: Self-pay | Admitting: Family Medicine

## 2023-12-09 ENCOUNTER — Telehealth: Payer: Self-pay

## 2023-12-09 NOTE — Telephone Encounter (Signed)
 Pt advised. Reports he will contact [facility]

## 2023-12-09 NOTE — Telephone Encounter (Signed)
 If pt is not able to receive  leave forms/ short term disability forms from [place] clinic provider who recently evaluated him, he will need an OV for the next available provider in this clinic.

## 2023-12-09 NOTE — Telephone Encounter (Signed)
 Patient called stating he is trying to get insured with Medicaid and need a doctor note stating if patient is able to work or not work due to being diagnosed with Avascular necrosis of bone on L hip. Patient was seen at [facility] 11/26/23 and has been out of work since, currently on crutches. Please advise.

## 2023-12-28 ENCOUNTER — Ambulatory Visit: Payer: Self-pay | Admitting: Dietician

## 2024-01-18 ENCOUNTER — Ambulatory Visit: Payer: Self-pay | Admitting: Dietician

## 2024-01-19 ENCOUNTER — Ambulatory Visit: Admitting: Family Medicine

## 2024-01-24 ENCOUNTER — Ambulatory Visit (INDEPENDENT_AMBULATORY_CARE_PROVIDER_SITE_OTHER): Admitting: Family Medicine

## 2024-01-24 DIAGNOSIS — Z91199 Patient's noncompliance with other medical treatment and regimen due to unspecified reason: Secondary | ICD-10-CM

## 2024-01-25 NOTE — Progress Notes (Signed)
 Patient no-showed today's appointment; appointment was for discuss hip concerns.

## 2024-01-26 ENCOUNTER — Ambulatory Visit: Admitting: Family Medicine

## 2024-02-16 ENCOUNTER — Encounter: Payer: Self-pay | Admitting: Family Medicine

## 2024-02-16 ENCOUNTER — Ambulatory Visit (INDEPENDENT_AMBULATORY_CARE_PROVIDER_SITE_OTHER): Admitting: Family Medicine

## 2024-02-16 VITALS — BP 134/105 | HR 101 | Resp 16 | Ht 74.0 in | Wt >= 6400 oz

## 2024-02-16 DIAGNOSIS — Z0000 Encounter for general adult medical examination without abnormal findings: Secondary | ICD-10-CM | POA: Diagnosis not present

## 2024-02-16 DIAGNOSIS — Z5986 Financial insecurity: Secondary | ICD-10-CM | POA: Diagnosis not present

## 2024-02-16 DIAGNOSIS — F439 Reaction to severe stress, unspecified: Secondary | ICD-10-CM

## 2024-02-16 DIAGNOSIS — R7303 Prediabetes: Secondary | ICD-10-CM

## 2024-02-16 DIAGNOSIS — I10 Essential (primary) hypertension: Secondary | ICD-10-CM

## 2024-02-16 DIAGNOSIS — G8929 Other chronic pain: Secondary | ICD-10-CM

## 2024-02-16 DIAGNOSIS — M25552 Pain in left hip: Secondary | ICD-10-CM

## 2024-02-16 MED ORDER — METFORMIN HCL 850 MG PO TABS: 850.0000 mg | ORAL_TABLET | Freq: Two times a day (BID) | ORAL | 1 refills | Status: DC

## 2024-02-16 MED ORDER — LOSARTAN POTASSIUM 25 MG PO TABS: 25.0000 mg | ORAL_TABLET | Freq: Every day | ORAL | 0 refills | Status: DC

## 2024-02-16 MED ORDER — WEGOVY 0.25 MG/0.5ML ~~LOC~~ SOAJ: 0.2500 mg | SUBCUTANEOUS | 1 refills | Status: DC

## 2024-02-16 NOTE — Progress Notes (Addendum)
 Complete physical exam  Patient: Eric Ryan   DOB: [birth year]   32 y.o. Male  MRN: [id number]  Introduced to nurse practitioner role and practice setting.  All questions answered.  Discussed provider/patient relationship and expectations.   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    Eric Ryan is a 33 y.o. male who presents today for a complete physical exam. He reports consuming a general diet. Exercise is limited by orthopedic condition(s): avascular hip. He generally feels poorly. He reports sleeping well. He does have additional problems to discuss today.   Discussed the use of AI scribe software for clinical note transcription with the patient, who gave verbal consent to proceed.  History of Present Illness Eric Ryan Jermain is a 33 year old male with a hip condition and prediabetes who presents for follow-up regarding his hip condition and weight management.  He has a history of a fall down stairs, initially thought to be a muscle strain, which has progressed to a severe hip condition requiring surgery. The condition worsened during his maternity leave, and he has been advised to remain out of work for the rest of the year to avoid further injury and prepare for surgery. He uses crutches for mobility and avoids activities that might exacerbate his condition. He experiences significant pain in the hip, especially when walking or sitting, and reports that he keeps his leg straight to avoid pain.  He is experiencing significant stress due to his inability to work and provide for his family, which includes two children. He wants to return to his normal life and work post-surgery. He has not discussed his feelings of stress and worry with his family to avoid burdening  them.  He is focused on weight management as part of his preparation for surgery. He has lost eight pounds and aims to reach a target weight of 350 pounds. He is considering medication to assist with weight loss, especially given his prediabetic status. He has not yet contacted his insurance to explore coverage for weight management medications.  He reports no current medication use. He has a history of elevated blood pressure, which was noted to be lower in the past when he was more active. He is concerned about his current blood pressure levels and is open to starting medication to manage it.  He has a dentist appointment scheduled for September 4th and a meeting with a nutritionist on September 3rd.       02/16/2024    2:10 PM 08/12/2023   10:23 AM  GAD 7 : Generalized Anxiety Score  Nervous, Anxious, on Edge 1 0  Control/stop worrying 1 0  Worry too much - different things 1 0  Trouble relaxing 0 0  Restless 0 0  Easily annoyed or irritable 0 0  Afraid - awful might happen 0 0  Total GAD 7 Score 3 0  Anxiety Difficulty Not difficult at all     Most recent fall risk assessment:     No data to display           Most recent depression screenings:    02/16/2024    2:10 PM 08/12/2023   10:23 AM  PHQ 2/9 Scores  PHQ - 2 Score 2 0  PHQ- 9 Score 4  0    Vision:Not within last year  and Dental: Receives regular dental care  Patient Active Problem List   Diagnosis Date Noted   Elevated blood pressure reading in office without diagnosis of hypertension 08/12/2023   Class 3 severe obesity without serious comorbidity with body mass index (BMI) of 50.0 to 59.9 in adult 08/12/2023   Strain of left groin 08/12/2023   Encounter to establish care with new doctor 08/12/2023   Tobacco use disorder, mild, abuse 08/12/2023   Past Medical History:  Diagnosis Date   Heart murmur    History reviewed. No pertinent surgical history. Social History   Tobacco Use   Smoking status: Light  Smoker   Smokeless tobacco: Never  Vaping Use   Vaping status: Never Used  Substance Use Topics   Alcohol use: Not Currently    Alcohol/week: 1.0 standard drink of alcohol    Types: 1 Standard drinks or equivalent per week    Comment: 3x cocktails per month   Drug use: Not Currently    Types: Marijuana   No Known Allergies   Patient Care Team: Wellington Curtis LABOR, FNP as PCP - General (Family Medicine)   Outpatient Medications Prior to Visit  Medication Sig   meloxicam  (MOBIC ) 7.5 MG tablet Take 1 tablet (7.5 mg total) by mouth daily.   traMADol  (ULTRAM ) 50 MG tablet Take 1 tablet (50 mg total) by mouth every 6 (six) hours as needed.   No facility-administered medications prior to visit.    ROS        Objective:     BP (!) 134/105 (BP Location: Left Arm, Patient Position: Sitting, Cuff Size: Large)   Pulse (!) 101   Resp 16   Ht 6' 2 (1.88 m)   Wt (!) 415 lb 11.2 oz (188.6 kg)   SpO2 100%   BMI 53.37 kg/m  BP Readings from Last 3 Encounters:  02/16/24 (!) 134/105  11/10/23 (!) 138/102  08/12/23 (!) 125/93   Wt Readings from Last 3 Encounters:  02/16/24 (!) 415 lb 11.2 oz (188.6 kg)  08/12/23 (!) 415 lb 4.8 oz (188.4 kg)  07/06/23 (!) 415 lb (188.2 kg)      Physical Exam Constitutional:      General: He is not in acute distress.    Appearance: Normal appearance. He is obese. He is not ill-appearing, toxic-appearing or diaphoretic.  HENT:     Head: Normocephalic.     Right Ear: Tympanic membrane normal.     Left Ear: Tympanic membrane normal.     Nose: Nose normal.     Mouth/Throat:     Mouth: Mucous membranes are moist.     Pharynx: Oropharynx is clear. No oropharyngeal exudate or posterior oropharyngeal erythema.  Eyes:     General: Lids are normal.        Right eye: No discharge.        Left eye: No discharge.     Extraocular Movements: Extraocular movements intact.     Right eye: Normal extraocular motion.     Left eye: Normal extraocular  motion.     Conjunctiva/sclera: Conjunctivae normal.     Right eye: Right conjunctiva is not injected.     Left eye: Left conjunctiva is not injected.     Pupils: Pupils are equal, round, and reactive to light.  Neck:     Thyroid: No thyroid mass, thyromegaly or thyroid tenderness.     Vascular: No carotid bruit.  Cardiovascular:  Rate and Rhythm: Normal rate and regular rhythm.     Pulses: Normal pulses.          Radial pulses are 2+ on the right side and 2+ on the left side.       Posterior tibial pulses are 2+ on the right side and 2+ on the left side.     Heart sounds: Normal heart sounds, S1 normal and S2 normal. No murmur heard.    No friction rub. No gallop.  Pulmonary:     Effort: Pulmonary effort is normal. No respiratory distress.     Breath sounds: Normal breath sounds. No stridor. No wheezing, rhonchi or rales.  Abdominal:     General: Bowel sounds are normal. There is no distension.     Palpations: Abdomen is soft. There is no mass.     Tenderness: There is no abdominal tenderness. There is no right CVA tenderness, left CVA tenderness, guarding or rebound.     Hernia: No hernia is present.  Musculoskeletal:        General: No swelling.     Cervical back: Normal range of motion. No rigidity.     Left hip: Tenderness present. Decreased range of motion. Decreased strength.     Comments: Ambulating with crutches  Lymphadenopathy:     Cervical: No cervical adenopathy.     Right cervical: No superficial, deep or posterior cervical adenopathy.    Left cervical: No superficial, deep or posterior cervical adenopathy.  Skin:    General: Skin is warm and dry.     Capillary Refill: Capillary refill takes less than 2 seconds.     Findings: No bruising or erythema.  Neurological:     General: No focal deficit present.     Mental Status: He is alert and oriented to person, place, and time. Mental status is at baseline.     GCS: GCS eye subscore is 4. GCS verbal subscore is 5.  GCS motor subscore is 6.     Cranial Nerves: No cranial nerve deficit.     Sensory: No sensory deficit.     Motor: No weakness, tremor or pronator drift.     Coordination: Romberg sign negative. Coordination normal.     Gait: Gait is intact. Gait normal.  Psychiatric:        Attention and Perception: Attention and perception normal.        Mood and Affect: Mood and affect normal.        Speech: Speech normal.        Behavior: Behavior normal. Behavior is cooperative.        Thought Content: Thought content normal.        Cognition and Memory: Cognition and memory normal.        Judgment: Judgment normal.      No results found for any visits on 02/16/24.     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 The condition has worsened since the initial fall, and he is currently unable to work. Surgery is planned but requires weight loss to proceed. He is currently on leave from work until the end of the year to focus on weight loss and preparation for surgery. - Avoid activities that may worsen the hip condition. - Proceed with weight loss  to reach the target weight for surgery of 350lbs, BMI = 40 - Consider minimal at-home exercises like chair yoga to maintain some physical activity without stressing the hip. - meeting with nutritionist in coming weeks  Morbid Obesity with prediabetes Obesity with prediabetes, contributing to the need for weight loss prior to hip surgery. He has lost 8 pounds and is motivated to continue losing weight. Metformin  is considered to aid in weight loss and reduce A1c levels. Discussed potential side effects of metformin , including gastrointestinal disturbances. - Start metformin  to aid in weight loss and reduce A1c levels. - Will  order Wegovy  0.25mg  weekly - Order labs to check current A1c  - Pt to call and see what weight mgmt medications are covered by plan - Encourage continued weight loss efforts and follow up with a nutritionist on September 3rd.  Hypertension Elevated blood pressure during the visit. Recent weight gain and inactivity may have contributed to the current elevation. - Start losartan  25mg  daily GOAL<120/80 - Follow up in 4 weeks to monitor blood pressure response to medication.  Situational stress and low mood related to disability and surgery Experiencing situational stress and low mood due to disability, inability to work, and the need for surgery. He expresses significant worry about his future and providing for his family. He is motivated to improve his situation but is currently feeling down. Discussed the option of cognitive behavioral therapy to address these issues. - Offer referral to behavioral health for cognitive behavioral therapy to address situational stress and low mood. - Pt declines today, but will reach out if he decides to   Hyperlipidemia - Lifestyle management - Will recheck LP today  Annual Physical  - due to Tdap - unable to give today no office supply  Things to do to keep yourself healthy  - Exercise at least 30-45 minutes a day, 3-4 days a week.  - Eat a low-fat diet with lots of fruits and vegetables, up to 7-9 servings per day.  - Seatbelts can save your life. Wear them always.  - Smoke detectors on every level of your home, check batteries every year.  - Eye Doctor - have an eye exam every 1-2 years  - Safe sex - if you may be exposed to STDs, use a condom.  - Alcohol -  If you drink, do it moderately, less than 2 drinks per day.  - Health Care Power of Attorney. Choose someone to speak for you if you are not able.  - Depression is common in our stressful world.If you're feeling down or losing interest in things you normally enjoy, please come in for a visit.   - Violence - If anyone is threatening or hurting you, please call immediately.   Health Maintenance  Topic Date Due   DTaP/Tdap/Td vaccine (3 - Tdap) 03/28/2008   Pneumococcal Vaccine for high risk medical condition (1 of 2 - PCV) Never done   Hepatitis B Vaccine (1 of 3 - 19+ 3-dose series) Never done   HPV Vaccine (1 - 3-dose SCDM series) Never done   COVID-19 Vaccine (1 - 2024-25 season) Never done   Flu Shot  02/04/2024   Hepatitis C Screening  Completed   HIV Screening  Completed   Meningitis B Vaccine  Aged Out    Discussed health benefits of physical activity, and encouraged him to engage in regular exercise appropriate for his age and condition.  Annual physical exam -     Comprehensive metabolic panel with GFR -  Hemoglobin A1c -     Lipid panel -     CBC  Primary hypertension -     Losartan  Potassium; Take 1 tablet (25 mg total) by mouth daily.  Dispense: 90 tablet; Refill: 0  Financially insecure -     AMB Referral VBCI Care Management  Prediabetes -     metFORMIN  HCl; Take 1 tablet (850 mg total) by mouth 2 (two) times daily with a meal. Start with one tablet, once a day, for first two weeks, then can increase to twice daily  Dispense: 180 tablet; Refill: 1  Morbid obesity (HCC) -     metFORMIN  HCl; Take 1 tablet (850 mg total) by mouth 2 (two) times daily with a meal. Start with one tablet, once a day, for first two weeks, then can increase to twice daily  Dispense: 180 tablet; Refill: 1  Chronic left hip pain  Situational stress     Return in about 4 weeks (around 03/15/2024) for BP check.   I, [name], FNP, have reviewed all documentation for this visit. The documentation on 02/16/24 for the exam, diagnosis, procedures, and orders are all accurate and complete.   [name], FNP

## 2024-02-16 NOTE — Addendum Note (Signed)
 Addended by: Embrie Mikkelsen on: 02/16/2024 05:08 PM   Modules accepted: Orders

## 2024-02-17 ENCOUNTER — Ambulatory Visit: Payer: Self-pay | Admitting: Family Medicine

## 2024-02-17 LAB — COMPREHENSIVE METABOLIC PANEL WITH GFR
ALT: 19 IU/L (ref 0–44)
AST: 14 IU/L (ref 0–40)
Albumin: 4.7 g/dL (ref 4.1–5.1)
Alkaline Phosphatase: 74 IU/L (ref 44–121)
BUN/Creatinine Ratio: 9 (ref 9–20)
BUN: 11 mg/dL (ref 6–20)
Bilirubin Total: 0.5 mg/dL (ref 0.0–1.2)
CO2: 22 mmol/L (ref 20–29)
Calcium: 10 mg/dL (ref 8.7–10.2)
Chloride: 100 mmol/L (ref 96–106)
Creatinine, Ser: 1.18 mg/dL (ref 0.76–1.27)
Globulin, Total: 3 g/dL (ref 1.5–4.5)
Glucose: 93 mg/dL (ref 70–99)
Potassium: 4.5 mmol/L (ref 3.5–5.2)
Sodium: 140 mmol/L (ref 134–144)
Total Protein: 7.7 g/dL (ref 6.0–8.5)
eGFR: 83 mL/min/1.73 (ref >59)

## 2024-02-17 LAB — LIPID PANEL
Chol/HDL Ratio: 5.9 ratio — ABNORMAL HIGH (ref 0.0–5.0)
Cholesterol, Total: 218 mg/dL — ABNORMAL HIGH (ref 100–199)
HDL: 37 mg/dL — ABNORMAL LOW (ref >39)
LDL Chol Calc (NIH): 161 mg/dL — ABNORMAL HIGH (ref 0–99)
Triglycerides: 112 mg/dL (ref 0–149)
VLDL Cholesterol Cal: 20 mg/dL (ref 5–40)

## 2024-02-17 LAB — CBC
Hematocrit: 46.8 % (ref 37.5–51.0)
Hemoglobin: 14.7 g/dL (ref 13.0–17.7)
MCH: 25.2 pg — ABNORMAL LOW (ref 26.6–33.0)
MCHC: 31.4 g/dL — ABNORMAL LOW (ref 31.5–35.7)
MCV: 80 fL (ref 79–97)
Platelets: 456 x10E3/uL — ABNORMAL HIGH (ref 150–450)
RBC: 5.84 x10E6/uL — ABNORMAL HIGH (ref 4.14–5.80)
RDW: 15.2 % (ref 11.6–15.4)
WBC: 8 x10E3/uL (ref 3.4–10.8)

## 2024-02-17 LAB — HEMOGLOBIN A1C
Est. average glucose Bld gHb Est-mCnc: 120 mg/dL
Hgb A1c MFr Bld: 5.8 % — ABNORMAL HIGH (ref 4.8–5.6)

## 2024-02-17 NOTE — Telephone Encounter (Signed)
 Pt was made aware of Wegovy  being sent over to the pharmacy. Pt verbalized understanding.

## 2024-02-24 ENCOUNTER — Encounter: Admitting: Family Medicine

## 2024-02-28 ENCOUNTER — Encounter: Payer: Self-pay | Admitting: Family Medicine

## 2024-02-28 NOTE — Telephone Encounter (Signed)
 Please see the message below.

## 2024-02-29 ENCOUNTER — Other Ambulatory Visit (HOSPITAL_COMMUNITY): Payer: Self-pay

## 2024-02-29 ENCOUNTER — Telehealth: Payer: Self-pay | Admitting: Pharmacy Technician

## 2024-02-29 NOTE — Telephone Encounter (Signed)
 Noted. Will send to Prior authorization team.   Thank you.

## 2024-02-29 NOTE — Telephone Encounter (Unsigned)
 Copied from CRM #[id number]. Topic: Clinical - Medication Question >> Feb 28, 2024  9:38 AM Nathanel BROCKS wrote: Reason for CRM: Prior authorization  Pt called to let Ms Wellington know that he will need a prior authorization for the semaglutide -weight management (WEGOVY ) 0.25 MG/0.5ML SOAJ SQ injection.  Please call. [phone]

## 2024-02-29 NOTE — Telephone Encounter (Signed)
 Pharmacy Patient Advocate Encounter   Received notification from Onbase that prior authorization for Wegovy  0.25 mg/0.58ml is required/requested.   Insurance verification completed.   The patient is insured through [employer] .   Per test claim: The current 28 day co-pay is, $1,397.69.  No PA needed at this time. This test claim was processed through [facility]- copay amounts may vary at other pharmacies due to pharmacy/plan contracts, or as the patient moves through the different stages of their insurance plan.       Eric Ryan plan only allow a discount savings, he can go to [email or web address] and get a copay savings card which will bring it down to $650.00

## 2024-03-01 ENCOUNTER — Telehealth: Payer: Self-pay | Admitting: Family Medicine

## 2024-03-01 NOTE — Addendum Note (Signed)
 Addended by: LILIAN SEVERO RAMAN on: 03/01/2024 11:13 AM   Modules accepted: Orders

## 2024-03-01 NOTE — Telephone Encounter (Signed)
 PA was already done to my knowledge. Referred patient to pharmacy for medication assistance.

## 2024-03-01 NOTE — Addendum Note (Signed)
 Addended by: Coralyn Roselli on: 03/01/2024 11:19 AM   Modules accepted: Orders

## 2024-03-01 NOTE — Telephone Encounter (Signed)
 Signed order for pharmacy referral

## 2024-03-03 ENCOUNTER — Telehealth: Payer: Self-pay

## 2024-03-03 ENCOUNTER — Encounter: Payer: Self-pay | Admitting: Family Medicine

## 2024-03-03 ENCOUNTER — Other Ambulatory Visit (HOSPITAL_COMMUNITY): Payer: Self-pay

## 2024-03-03 NOTE — Progress Notes (Signed)
 Care Guide Pharmacy Note  03/03/2024 Name: Eric Ryan MRN: [id number] DOB: 1990/08/05  Referred By: Wellington Curtis LABOR, FNP Reason for referral: Complex Care Management and Call Attempt #1 (Unsuccessful initial outreach to schedule with PHARM D- Allyson)   Eric Ryan is a 33 y.o. year old male who is a primary care patient of Wellington Curtis LABOR, FNP.  Eric Ryan was referred to the pharmacist for assistance related to: medication assistance  An unsuccessful telephone outreach was attempted today to contact the patient who was referred to the pharmacy team for assistance with medication assistance. Additional attempts will be made to contact the patient.  Eric Ryan, [facility] Guide  Direct Dial: [phone]  Fax [phone]

## 2024-03-07 NOTE — Progress Notes (Signed)
 Care Guide Pharmacy Note  03/07/2024 Name: Eric Ryan MRN: [id number] DOB: Nov 22, 1990  Referred By: Wellington Curtis LABOR, FNP Reason for referral: Complex Care Management, Call Attempt #1 (Unsuccessful initial outreach to schedule with PHARM D- Allyson), and Call Attempt #2 (Successful initial outreach scheduled with PHARM D- Allyson )   Eric Ryan is a 32 y.o. year old male who is a primary care patient of Wellington Curtis LABOR, FNP.  Eric Ryan was referred to the pharmacist for assistance related to: medication assistance  Successful contact was made with the patient to discuss pharmacy services including being ready for the pharmacist to call at least 5 minutes before the scheduled appointment time and to have medication bottles and any blood pressure readings ready for review. The patient agreed to meet with the pharmacist via In Person on (date/time). 03/23/24 @ 1 PM.   Eric Ryan [facility], [facility] Guide  Direct Dial: [phone]  Fax [phone]

## 2024-03-08 ENCOUNTER — Encounter: Payer: Self-pay | Admitting: Dietician

## 2024-03-08 ENCOUNTER — Encounter: Attending: Orthopedic Surgery | Admitting: Dietician

## 2024-03-08 VITALS — Ht 74.0 in | Wt >= 6400 oz

## 2024-03-08 DIAGNOSIS — Z713 Dietary counseling and surveillance: Secondary | ICD-10-CM | POA: Insufficient documentation

## 2024-03-08 DIAGNOSIS — Z6841 Body Mass Index (BMI) 40.0 and over, adult: Secondary | ICD-10-CM | POA: Insufficient documentation

## 2024-03-08 DIAGNOSIS — E66813 Obesity, class 3: Secondary | ICD-10-CM

## 2024-03-08 NOTE — Progress Notes (Signed)
 Medical Nutrition Therapy  Appointment Start time:  99  Appointment End time:  1120  Primary concerns today: Weight Loss  Referral diagnosis: E66.01 - Morbid Obesity Preferred learning style: No preference indicated Learning readiness: Change in progress   NUTRITION ASSESSMENT   Anthropometrics Ht: 74 Wt: 415.2 lbs BMI: 53.31 kg/m2 Goal weight: 360 lbs  Clinical Medical Hx: Obesity, Elevated BP, Tobacco use, AVN Medications: N/A Labs: Cholesterol - 218 mg/dL, LDL - 838, HDL - 37, J8r - 5.8% Notable Signs/Symptoms: Unstable gait/Limp   Lifestyle & Dietary Hx Pt reports goal of weight loss for hip surgery. Pt reports fall resulting in L hip pain since February, has been on crutches for the last 4 months, states current plan is to lose weight to goal weight for hip replacement surgery. Pt reports being out of work for the last few months because of injury, initially started PT until released from job, currently applying for disability. Pt reports losing 15 lbs already by cutting back on snacking, eating more fruits and vegetables, trying to eat healthier overall. Pt reports eating only 1 meal a day for the past month. Pt reports being prescribed WeGovy , but insurance will not cover it.    Estimated daily fluid intake: >64 oz Supplements: N/A Sleep: Sleeps ok Stress / self-care: High Current average weekly physical activity: ADLs, hindered by hip injury  24-Hr Dietary Recall First Meal: Pork shoulder, mac and cheese, collard greens Snack:  Second Meal:  Snack:  Third Meal:  Snack:  Beverages: Water    NUTRITION DIAGNOSIS  Renovo-3.3 Overweight/obesity As related to positive energy balance.  As evidenced by BMI of 53.31 kg/m2, very limited activity related to hip injury, diet history of energy dense foods, IF.   NUTRITION INTERVENTION  Nutrition education (E-1) on the following topics:  Educated patient on the two components of energy balance: Energy in (calories), and  energy out (activity). Explain the role of negative energy balance in weight loss. Discussed options with patient to achieve a negative energy balance and how to best control energy in and energy out to accommodate their lifestyle.   Handouts Provided Include  Food assistance resources Proteins list Non-Starchy Vegetables list Fruits list  Learning Style & Readiness for Change Teaching method utilized: Visual & Auditory  Demonstrated degree of understanding via: Teach Back  Barriers to learning/adherence to lifestyle change: Hip injury  Goals Established by Pt Work towards eating three times a day, about 5-6 hours apart! Incorporate a source of low-fat protein each time you eat. Choose high fiber foods as often as possible like fruits, vegetables, whole grain products, beans. Moderate sugary beverages like soda or juice and choose Zero sugar sodas/juices or low/no calorie drink packets. Increase your activity with seated upper body exercises. Do these as often as you can throughout the day.   MONITORING & EVALUATION Dietary intake, weekly physical activity, and weight change in 6-8 weeks.  Next Steps  Patient is to follow up with RD.

## 2024-03-08 NOTE — Patient Instructions (Addendum)
 Work towards eating three times a day, about 5-6 hours apart!  Incorporate a source of low-fat protein each time you eat.  Choose high fiber foods as often as possible like fruits, vegetables, whole grain products, beans.  Moderate sugary beverages like soda or juice and choose Zero sugar sodas/juices or low/no calorie drink packets.  Increase your activity with seated upper body exercises. Do these as often as you can throughout the day.

## 2024-03-15 ENCOUNTER — Ambulatory Visit: Admitting: Family Medicine

## 2024-03-23 ENCOUNTER — Ambulatory Visit

## 2024-03-23 NOTE — Progress Notes (Deleted)
 03/23/2024 Name: Eric Ryan MRN: [id number] DOB: Apr 10, 1991  Subjective  No chief complaint on file.   Reason for visit: ?  Eric Ryan is a 33 y.o. male with a history of obesity/overweight who presents today for a pharmacotherapy visit.?  Pertinent PMH also includes preDM, obesity, and tobacco use disorder.   Care Team: Primary Care Provider: Wellington Curtis LABOR, FNP   At last visit with PCP, patient had an 8 pound weight loss and was motivated to continue losing weight in order to be able to obtain hip surgery. Patient was started on metformin  850 mg BID and Wegovy  0.25 mg weekly at that time.   Cost of Wegovy  on insurance with a copay card is $650.   Medication Access/Adherence: Prescription drug coverage: Payor: [place] MEDICAID PREPAID HEALTH PLAN / Plan: [place] MEDICAID [employer]UNITY / Product Type: *No Product type* / .   Reported Regimen: ?  *** ***   Medications tried in the past:? none   Reported Diet: Patient typically eats *** meals per day.  Breakfast: *** Lunch: *** Dinner: *** Snacks: *** Beverages: ***  Reported Exercise: *** Previously Established Exercise Goals: ***  Relevant Medical History: Glaucoma: {Yes /No with options:49760} Palpitations: {Yes /No with options:49760} Chest Pain: {Yes /No with options:49760} Headaches: {Yes /No with options:49760} Nephrolithiasis: {Yes /No with options:49760} Seizures: {Yes /No with options:49760} H/o pancreatitis: {Yes /No with options:49760} Personal or family history of medullary cancer of thyroid:{Yes /No with options:49760}   Weight Management Progress: Goal weight: *** Initial weight: ***  Weight at last visit: 415 lb Current weight:  *** (03/23/24) Percentage weight loss: Since initial visit: *** Since last visit: ***  Cardiovascular Risk Reduction History of clinical ASCVD? {Blank single:19197::yes,no} The ASCVD Risk score (Arnett DK, et al., 2019) failed to  calculate for the following reasons:   The 2019 ASCVD risk score is only valid for ages 51 to 56 History of heart failure? {Blank single:19197::yes,no} {HF MEDS CURRENT:31255} History of hyperlipidemia? {Blank single:19197::yes,no} Current BMI: *** kg/m2 (Ht 6'2 in, Wt *** kg) Taking SGLT-2i? no Taking GLP- 1 RA? no _______________________________________________  Objective    Physical Examination:  Vitals:  There were no vitals filed for this visit. There is no height or weight on file to calculate BMI. Wt Readings from Last 6 Encounters:  03/08/24 (!) 415 lb 3.2 oz (188.3 kg)  02/16/24 (!) 415 lb 11.2 oz (188.6 kg)  08/12/23 (!) 415 lb 4.8 oz (188.4 kg)  07/06/23 (!) 415 lb (188.2 kg)  06/29/17 285 lb (129.3 kg)  05/03/17 290 lb (131.5 kg)   Wt Readings from Last 3 Encounters:  03/08/24 (!) 415 lb 3.2 oz (188.3 kg)  02/16/24 (!) 415 lb 11.2 oz (188.6 kg)  08/12/23 (!) 415 lb 4.8 oz (188.4 kg)     Labs:?  Lab Results  Component Value Date   NA 140 02/16/2024   K 4.5 02/16/2024   CL 100 02/16/2024   CO2 22 02/16/2024   BUN 11 02/16/2024   CREATININE 1.18 02/16/2024   CALCIUM 10.0 02/16/2024   ALBUMIN 4.7 02/16/2024   Lab Results  Component Value Date   ALKPHOS 74 02/16/2024   BILITOT 0.5 02/16/2024   PROT 7.7 02/16/2024   ALBUMIN 4.7 02/16/2024   ALT 19 02/16/2024   AST 14 02/16/2024   No components found for: A1C Lab Results  Component Value Date   CHOL 218 (H) 02/16/2024   CHOL 181 08/12/2023   Lab Results  Component Value Date  HDL 37 (L) 02/16/2024   HDL 32 (L) 08/12/2023   No components found for: LDL No results found for: VLDL No components found for: [facility] Lab Results  Component Value Date   TRIG 112 02/16/2024   TRIG 91 08/12/2023   Lab Results  Component Value Date   TSH 0.847 08/12/2023    Assessment and Plan:   # Weight Management: {CHL Controlled/[phone]} per last A1c of ***% (***),  {Increased/decreased/na:15831} from previous. Tolerating medications well at this time***. Reports {LZAPWEIGHTMEDSYPTOMS:32419}. Current Regimen: *** Weight Monitoring: *** Weight Progress: *** Diet: *** Exercise: *** Continue medications today without changes.  *** Future Consideration: GLP1-RA: {LZAPWEIGHTLOSSMEDS:32420} Phentermine: {LZAPWEIGHTLOSSMEDS:32420} Bupropion: {LZAPWEIGHTLOSSMEDS:32420} Qsymia (phentermine/topiramate): {LZAPWEIGHTLOSSMEDS:32420} Contrave (Naltrexone/bupropion): Topiramate:  Orlistat: No longer recommended by AGP.  ***  # Cardiorenal Risk Reduction:  Key risk factors include: {cvs risk:31001} The ASCVD Risk score (Arnett DK, et al., 2019) failed to calculate for the following reasons:   The 2019 ASCVD risk score is only valid for ages 53 to 52 indicated patient is at *** risk.  PREVENT: 10-Yr CAD ***%, 30-Yr CAD ***% indicated patient is at *** risk.  (those without ASCVD, CHF), Age 47-79 (includes additional metabolic risk factors, BMI, GFR, UACR, no race) Lipids: {CHL DESC; PROGRESS TOWARD GOAL:21523} on last lipid panel with LDL *** mg/dL, TG *** mg/dL (***). LDL goal {ldl goal:31310}.  BP: *** BG: *** Renal: *** Continue medications today without changes.  *** Future Consideration: *** ***  Delete - reference only Lab Results  Component Value Date   LDLCALC 161 (H) 02/16/2024   LDLCALC 132 (H) 08/12/2023   TRIG 112 02/16/2024   TRIG 91 08/12/2023   HGBA1C 5.8 (H) 02/16/2024    Follow Up Follow up with clinical pharmacist via *** in *** weeks  Future Appointments  Date Time Provider Department Center  03/23/2024  1:00 PM [facility]-PHARMACIST [facility] [place]  05/03/2024 10:15 AM Katheleen Aleene ORN, RD [facility] None    Peyton CHARLENA Ferries, PharmD Clinical Pharmacist [facility] [phone]

## 2024-04-05 ENCOUNTER — Telehealth (INDEPENDENT_AMBULATORY_CARE_PROVIDER_SITE_OTHER): Admitting: Family Medicine

## 2024-04-05 ENCOUNTER — Encounter: Payer: Self-pay | Admitting: Family Medicine

## 2024-04-05 DIAGNOSIS — G8929 Other chronic pain: Secondary | ICD-10-CM

## 2024-04-05 DIAGNOSIS — M25552 Pain in left hip: Secondary | ICD-10-CM

## 2024-04-05 DIAGNOSIS — R7303 Prediabetes: Secondary | ICD-10-CM | POA: Diagnosis not present

## 2024-04-05 DIAGNOSIS — I10 Essential (primary) hypertension: Secondary | ICD-10-CM

## 2024-04-05 MED ORDER — METFORMIN HCL 850 MG PO TABS: 850.0000 mg | ORAL_TABLET | Freq: Two times a day (BID) | ORAL | 1 refills | Status: AC

## 2024-04-05 MED ORDER — LOSARTAN POTASSIUM 25 MG PO TABS: 25.0000 mg | ORAL_TABLET | Freq: Every day | ORAL | 0 refills | Status: AC

## 2024-04-05 NOTE — Progress Notes (Signed)
 Virtual Visit via Video Note  I connected with Eric Ryan on 04/05/24 at  1:00 PM EDT by a video enabled telemedicine application and verified that I am speaking with the correct person using two identifiers.  Patient Location: Home Provider Location: Office/Clinic  I discussed the limitations, risks, security, and privacy concerns of performing an evaluation and management service by video and the availability of in person appointments. I also discussed with the patient that there may be a patient responsible charge related to this service. The patient expressed understanding and agreed to proceed.  Subjective: PCP: Wellington Curtis LABOR, FNP  Chief Complaint  Patient presents with   Hypertension   HPI Discussed the use of AI scribe software for clinical note transcription with the patient, who gave verbal consent to proceed.  History of Present Illness Eric Ryan is a 33 year old male with hypertension who presents for a follow-up on blood pressure management.  He has been experiencing difficulties obtaining his blood pressure medications, losartan  and metformin , from the pharmacy for about a week. The pharmacy has been unable to locate his prescription, and he was informed that a new prescription needs to be called in.  He is also encountering insurance issues related to obtaining Wegovy  for weight management. He was informed that a prescription is required and the cost would be about $1600.  He is preparing for hip surgery and is focused on weight loss. He is not experiencing any pain but moves cautiously due to hip issues, which limit his physical activity. He has no other new concerns and states that 'nothing much has changed.'  He uses MyChart for communication and prefers virtual appointments due to lack of transportation.    ROS: Per HPI  Current Outpatient Medications:    losartan  (COZAAR ) 25 MG tablet, Take 1 tablet (25 mg total) by mouth  daily., Disp: 90 tablet, Rfl: 0   meloxicam  (MOBIC ) 7.5 MG tablet, Take 1 tablet (7.5 mg total) by mouth daily. (Patient not taking: Reported on 03/08/2024), Disp: 14 tablet, Rfl: 0   metFORMIN  (GLUCOPHAGE ) 850 MG tablet, Take 1 tablet (850 mg total) by mouth 2 (two) times daily with a meal. Start with one tablet, once a day, for first two weeks, then can increase to twice daily, Disp: 180 tablet, Rfl: 1   semaglutide -weight management (WEGOVY ) 0.25 MG/0.5ML SOAJ SQ injection, Inject 0.25 mg into the skin once a week. (Patient not taking: Reported on 03/08/2024), Disp: 2 mL, Rfl: 1   traMADol  (ULTRAM ) 50 MG tablet, Take 1 tablet (50 mg total) by mouth every 6 (six) hours as needed. (Patient not taking: Reported on 03/08/2024), Disp: 15 tablet, Rfl: 0  Observations/Objective: There were no vitals filed for this visit. Physical Exam Constitutional:      General: He is not in acute distress.    Appearance: Normal appearance. He is not ill-appearing, toxic-appearing or diaphoretic.  HENT:     Head: Normocephalic.  Eyes:     Pupils: Pupils are equal, round, and reactive to light.  Pulmonary:     Effort: Pulmonary effort is normal.  Neurological:     General: No focal deficit present.     Mental Status: He is alert and oriented to person, place, and time. Mental status is at baseline.  Psychiatric:        Attention and Perception: Attention and perception normal. He is attentive.        Mood and Affect: Mood and affect normal. Mood is  not anxious or depressed. Affect is not flat or tearful.        Speech: Speech normal.        Behavior: Behavior normal. Behavior is cooperative.        Thought Content: Thought content normal.        Cognition and Memory: Cognition and memory normal.        Judgment: Judgment normal.     Assessment and Plan: Prediabetes -     metFORMIN  HCl; Take 1 tablet (850 mg total) by mouth 2 (two) times daily with a meal. Start with one tablet, once a day, for first two  weeks, then can increase to twice daily  Dispense: 180 tablet; Refill: 1  Morbid obesity (HCC) -     metFORMIN  HCl; Take 1 tablet (850 mg total) by mouth 2 (two) times daily with a meal. Start with one tablet, once a day, for first two weeks, then can increase to twice daily  Dispense: 180 tablet; Refill: 1  Primary hypertension -     Losartan  Potassium; Take 1 tablet (25 mg total) by mouth daily.  Dispense: 90 tablet; Refill: 0   Assessment and Plan Assessment & Plan Hypertension Management is ongoing with losartan . Prescription issues have delayed medication access. - Resent prescription for losartan  25mg  daily - Scheduled follow-up appointment to check blood pressure in person on October 30th at 10:00 AM.  Morbid Obesity Management is ongoing with a focus on weight loss to prepare for surgery. Insurance issues have delayed access to Wegovy , and a pharmacist is involved to explore affordable options. - Confirmed appointment with pharmacist Isaiah Ferries on October 29th at 11:00 AM to discuss medication options and insurance issues. - Encouraged use of MyChart for communication regarding appointment details.  Prediabetes Management includes metformin . Prescription issues have delayed medication access. - Resent prescription for metformin    Chronic left hip pain Persists, limiting physical activity. No acute changes reported. - working on [employer] for hip surgery with ortho   Follow Up Instructions: No follow-ups on file.   I discussed the assessment and treatment plan with the patient. The patient was provided an opportunity to ask questions, and all were answered. The patient agreed with the plan and demonstrated an understanding of the instructions.   The patient was advised to call back or seek an in-person evaluation if the symptoms worsen or if the condition fails to improve as anticipated.  The above assessment and management plan was discussed with the patient. The  patient verbalized understanding of and has agreed to the management plan.   [name], FNP

## 2024-04-06 ENCOUNTER — Other Ambulatory Visit (INDEPENDENT_AMBULATORY_CARE_PROVIDER_SITE_OTHER)

## 2024-04-06 DIAGNOSIS — R7303 Prediabetes: Secondary | ICD-10-CM

## 2024-04-06 NOTE — Progress Notes (Signed)
 04/06/2024 Name: Eric Ryan MRN: [id number] DOB: Sep 24, 1990  Subjective  Chief Complaint  Patient presents with   Medication Management    Weight Management    Reason for visit: ?  Eric Ryan is a 33 y.o. male with a history of obesity/overweight who presents today for a pharmacotherapy visit.?  Pertinent PMH also includes preDM, obesity, and tobacco use disorder.   Care Team: Primary Care Provider: Wellington Curtis LABOR, FNP   At visit with PCP on 02/16/24, patient had an 8 pound weight loss and was motivated to continue losing weight in order to be able to obtain hip surgery. Patient was started on metformin  850 mg BID and Wegovy  0.25 mg weekly at that time.   At last visit on 04/05/24, patient reported difficulty obtaining losartan  and metformin  from the pharmacy.   Cost of Wegovy  on insurance with a copay card is $650/month.  Medication Access/Adherence: Prescription drug coverage: Payor: [place] MEDICAID PREPAID HEALTH PLAN / Plan: [place] MEDICAID UNITEDHEALTHC[employer] COMMUNITY / Product Type: *No Product type* / .    Medications tried in the past:? none   Reported Diet: Patient typically eats 3 meals per day.  Breakfast: oatmeal, fruit, cereal,  Lunch: salads, baked chicken Dinner: rotisserie chicken salad Snacks: fruit, peanut butter cracker Beverages: water, crystal lite   Reported Exercise: dumbbell exercises; limited d/t hip pain  _______________________________________________  Objective    Physical Examination:  Vitals:  There were no vitals filed for this visit. There is no height or weight on file to calculate BMI. Wt Readings from Last 6 Encounters:  03/08/24 (!) 415 lb 3.2 oz (188.3 kg)  02/16/24 (!) 415 lb 11.2 oz (188.6 kg)  08/12/23 (!) 415 lb 4.8 oz (188.4 kg)  07/06/23 (!) 415 lb (188.2 kg)  06/29/17 285 lb (129.3 kg)  05/03/17 290 lb (131.5 kg)   Wt Readings from Last 3 Encounters:  03/08/24 (!) 415 lb 3.2 oz (188.3 kg)  02/16/24 (!)  415 lb 11.2 oz (188.6 kg)  08/12/23 (!) 415 lb 4.8 oz (188.4 kg)     Labs:?  Lab Results  Component Value Date   NA 140 02/16/2024   K 4.5 02/16/2024   CL 100 02/16/2024   CO2 22 02/16/2024   BUN 11 02/16/2024   CREATININE 1.18 02/16/2024   CALCIUM 10.0 02/16/2024   ALBUMIN 4.7 02/16/2024   Lab Results  Component Value Date   ALKPHOS 74 02/16/2024   BILITOT 0.5 02/16/2024   PROT 7.7 02/16/2024   ALBUMIN 4.7 02/16/2024   ALT 19 02/16/2024   AST 14 02/16/2024   No components found for: A1C Lab Results  Component Value Date   CHOL 218 (H) 02/16/2024   CHOL 181 08/12/2023   Lab Results  Component Value Date   HDL 37 (L) 02/16/2024   HDL 32 (L) 08/12/2023   No components found for: LDL No results found for: VLDL No components found for: [facility] Lab Results  Component Value Date   TRIG 112 02/16/2024   TRIG 91 08/12/2023   Lab Results  Component Value Date   TSH 0.847 08/12/2023    Assessment and Plan:   # Weight Management: Currently unable to afford weight loss GLP1 options via cash or through insurance. Discussed possibility of obtaining medications under a dx of pre-diabetes. It appears that his insurance is not paying towards any prescription fills due to a deductible.  -Encouraged patient to contact insurance company to inquire about cost and deductibles for the plan. Will  assess feasibility of GLP1 for preDM at follow up.  -Counseled for patient to increase stationary exercises, as able.   Follow-up:  Pharmacist on 04/14/24 PCP clinic visit on 05/04/24  Peyton CHARLENA Ferries, PharmD Clinical Pharmacist [facility] [phone]

## 2024-04-10 ENCOUNTER — Ambulatory Visit

## 2024-04-10 DIAGNOSIS — Z113 Encounter for screening for infections with a predominantly sexual mode of transmission: Secondary | ICD-10-CM

## 2024-04-10 DIAGNOSIS — Z202 Contact with and (suspected) exposure to infections with a predominantly sexual mode of transmission: Secondary | ICD-10-CM

## 2024-04-10 LAB — HM HIV SCREENING LAB: HM HIV Screening: NEGATIVE

## 2024-04-10 MED ORDER — METRONIDAZOLE 500 MG PO TABS: 2000.0000 mg | ORAL_TABLET | Freq: Once | ORAL | Status: AC

## 2024-04-10 NOTE — Progress Notes (Signed)
 Pt is here for std screening. The patient was dispensed #4 metronidazole  today. I provided counseling today regarding the medication. We discussed the medication, the side effects and when to call clinic. Patient given the opportunity to ask questions. Questions answered.  Larraine JONELLE Northern, RN

## 2024-04-10 NOTE — Progress Notes (Signed)
 [facility] Department STI clinic [address] [place] [address] Main phone: [phone]  STI screening visit  Subjective:  Eric Ryan is a 33 y.o. male being seen today for an STI screening visit. The patient reports they do not have symptoms.    Patient has the following medical conditions:  Patient Active Problem List   Diagnosis Date Noted   Elevated blood pressure reading in office without diagnosis of hypertension 08/12/2023   Class 3 severe obesity without serious comorbidity with body mass index (BMI) of 50.0 to 59.9 in adult [facility]) 08/12/2023   Strain of left groin 08/12/2023   Encounter to establish care with new doctor 08/12/2023   Tobacco use disorder, mild, abuse 08/12/2023   Chief Complaint  Patient presents with   SEXUALLY TRANSMITTED DISEASE   HPI Patient reports contact to trichomonas. His partner tested positive on Friday and notified him. She is his only partner. He has no symptoms.  See flowsheet for further details and programmatic requirements  Hyperlink available at the top of the signed note in blue.  Flow sheet content below:  Pregnancy Intention Screening Does the patient want to become pregnant in the next year?: No Does the patient's partner want to become pregnant in the next year?: No Would the patient like to discuss contraceptive options today?: N/A Reason For STD Screen STD Screening: Is a contact Have you ever had an STD?: No History of Antibiotic use in the past 2 weeks?: No STD Symptoms Denies all: Yes Risk Factors for Hep B Household, sexual, or needle sharing contact of a person infected with Hep B: No Sexual contact with a person who uses drugs not as prescribed?: No Currently or Ever used drugs not as prescribed: No HIV Positive: No PRep Patient: No Men who have sex with men: No Have Hepatitis C: No History of Incarceration: No History of Homeslessness?: No Anal sex following  anal drug use?: No Risk Factors for Hep C Currently using drugs not as prescribed: No Sexual partner(s) currently using drugs as not prescribed: No History of drug use: No HIV Positive: No People with a history of incarceration: No People born between the years of 96 and 21: No Abuse History Has patient ever been abused physically?: No Has patient ever been abused sexually?: No Does patient feel they have a problem with Anxiety?: No Does patient feel they have a problem with Depression?: No  Screening for MPX risk:  Unexplained rash?  No   MSM?  No   Multiple or anonymous sex partners?  No   Any close or sexual contact with a person  diagnosed with MPX?  No   Any outside the [place]  where MPX is endemic?  No   High clinical suspicion for MPX?    -Unlikely to be chickenpox    -Lymphadenopathy    -Rash that presents in same phase of       evolution on any given body part  No   STI screening history: Last HIV test per patient/review of record was No results found for: HMHIVSCREEN  Lab Results  Component Value Date   HIV Non Reactive 08/12/2023    Last HEPC test per patient/review of record was No results found for: HMHEPCSCREEN No components found for: HEPC   Last HEPB test per patient/review of record was No components found for: HMHEPBSCREEN   Fertility: Does the patient or their partner desires a pregnancy in the next year? No  Immunization History  Administered Date(s) Administered   DTP 11/29/1990, 09/16/1993   DTaP 11/29/1990, 09/16/1993   HIB (PRP-OMP) 11/29/1990   IPV 09/16/1993   MMR 09/16/1993   OPV 09/16/1993    The following portions of the patient's history were reviewed and updated as appropriate: allergies, current medications, past medical history, past social history, past surgical history and problem list.  Objective:  There were no vitals filed for this visit.  Physical Exam Vitals and nursing note reviewed.  Constitutional:       Appearance: Normal appearance. He is obese.  HENT:     Head: Normocephalic and atraumatic.     Mouth/Throat:     Mouth: Mucous membranes are moist.     Pharynx: No oropharyngeal exudate or posterior oropharyngeal erythema.  Eyes:     General:        Right eye: No discharge.        Left eye: No discharge.     Conjunctiva/sclera:     Right eye: Right conjunctiva is not injected. No exudate.    Left eye: Left conjunctiva is not injected. No exudate. Pulmonary:     Effort: Pulmonary effort is normal.  Abdominal:     General: Abdomen is protuberant.     Palpations: Abdomen is soft.  Genitourinary:    Comments: Declined genital exam- asymptomatic Skin:    General: Skin is warm and dry.     Findings: No rash.  Neurological:     Mental Status: He is alert and oriented to person, place, and time.    Assessment and Plan:  Eric Ryan is a 33 y.o. male presenting to the [facility] for STI screening  1. Screening for venereal disease (Primary)  - Chlamydia/GC NAA, Confirmation - HIV [place] [facility] - Syphilis Serology,  Lab  2. Trichomonas contact  - metroNIDAZOLE  (FLAGYL ) 500 MG tablet; Take 4 tablets (2,000 mg total) by mouth once for 1 dose.   Patient does not have STI symptoms Patient accepted the following screenings: urine CT/GC, HIV, and RPR Patient meets criteria for HepB screening? No. Ordered? no Patient meets criteria for HepC screening? No. Ordered? no Recommended condom use with all sex Discussed importance of condom use for STI prevention  Treat positive test results per standing order. Discussed time line for [facility] results and that patient will be called with positive results and encouraged patient to call if he had not heard in 2 weeks Recommended repeat testing in 3 months with positive results. Recommended returning for continued or worsening symptoms.   Return in about 3 months (around 07/11/2024).  Future  Appointments  Date Time Provider Department Center  04/14/2024  1:30 PM Issac Peyton BRAVO, [facility] None  05/03/2024 10:15 AM Katheleen Aleene ORN, RD [facility] None  05/04/2024 10:00 AM Wellington Curtis LABOR, FNP [facility] Michaela Damien BRAVO [place], NP

## 2024-04-13 LAB — CHLAMYDIA/GC NAA, CONFIRMATION
Chlamydia trachomatis, NAA: NEGATIVE
Neisseria gonorrhoeae, NAA: NEGATIVE

## 2024-04-14 ENCOUNTER — Other Ambulatory Visit: Payer: Self-pay

## 2024-04-14 NOTE — Progress Notes (Unsigned)
 04/14/2024 Name: Eric Ryan MRN: [id number] DOB: 05/22/1991  Subjective  No chief complaint on file.   Reason for visit: ?  Eric Ryan is a 33 y.o. male with a history of obesity/overweight who presents today for a pharmacotherapy visit.?  Pertinent PMH also includes preDM, obesity, and tobacco use disorder.   Care Team: Primary Care Provider: Wellington Ryan LABOR, FNP   At visit with PCP on 02/16/24, patient had an 8 pound weight loss and was motivated to continue losing weight in order to be able to obtain hip surgery. Patient was started on metformin  850 mg BID and Wegovy  0.25 mg weekly at that time.   At last visit on 04/05/24, patient reported difficulty obtaining losartan  and metformin  from the pharmacy.   Cost of Wegovy  on insurance with a copay card is $650/month.  Medication Access/Adherence: Prescription drug coverage: Payor: MEDICAID PENDING / Plan: MEDICAID PENDING / Product Type: *No Product type* / .    Medications tried in the past:? none   Reported Diet: Patient typically eats 3 meals per day.  Breakfast: oatmeal, fruit, cereal,  Lunch: salads, baked chicken Dinner: rotisserie chicken salad Snacks: fruit, peanut butter cracker Beverages: water, crystal lite   Reported Exercise: dumbbell exercises; limited d/t hip pain  _______________________________________________  Objective    Physical Examination:  Vitals:  There were no vitals filed for this visit. There is no height or weight on file to calculate BMI. Wt Readings from Last 6 Encounters:  03/08/24 (!) 415 lb 3.2 oz (188.3 kg)  02/16/24 (!) 415 lb 11.2 oz (188.6 kg)  08/12/23 (!) 415 lb 4.8 oz (188.4 kg)  07/06/23 (!) 415 lb (188.2 kg)  06/29/17 285 lb (129.3 kg)  05/03/17 290 lb (131.5 kg)   Wt Readings from Last 3 Encounters:  03/08/24 (!) 415 lb 3.2 oz (188.3 kg)  02/16/24 (!) 415 lb 11.2 oz (188.6 kg)  08/12/23 (!) 415 lb 4.8 oz (188.4 kg)     Labs:?  Lab Results   Component Value Date   NA 140 02/16/2024   K 4.5 02/16/2024   CL 100 02/16/2024   CO2 22 02/16/2024   BUN 11 02/16/2024   CREATININE 1.18 02/16/2024   CALCIUM 10.0 02/16/2024   ALBUMIN 4.7 02/16/2024   Lab Results  Component Value Date   ALKPHOS 74 02/16/2024   BILITOT 0.5 02/16/2024   PROT 7.7 02/16/2024   ALBUMIN 4.7 02/16/2024   ALT 19 02/16/2024   AST 14 02/16/2024   No components found for: A1C Lab Results  Component Value Date   CHOL 218 (H) 02/16/2024   CHOL 181 08/12/2023   Lab Results  Component Value Date   HDL 37 (L) 02/16/2024   HDL 32 (L) 08/12/2023   No components found for: LDL No results found for: VLDL No components found for: [facility] Lab Results  Component Value Date   TRIG 112 02/16/2024   TRIG 91 08/12/2023   Lab Results  Component Value Date   TSH 0.847 08/12/2023    Assessment and Plan:   # Weight Management: Currently unable to afford weight loss GLP1 options via cash or through insurance. Discussed possibility of obtaining medications under a dx of pre-diabetes. It appears that his insurance is not paying towards any prescription fills due to a deductible.  -Encouraged patient to contact insurance company to inquire about cost and deductibles for the plan. Will assess feasibility of GLP1 for preDM at follow up.  -Counseled for patient to increase stationary  exercises, as able.   Follow-up:  Pharmacist on 04/14/24 PCP clinic visit on 05/04/24  Eric Ryan, PharmD Clinical Pharmacist [facility] [phone]

## 2024-05-03 ENCOUNTER — Ambulatory Visit: Admitting: Dietician

## 2024-05-04 ENCOUNTER — Ambulatory Visit: Payer: Self-pay | Admitting: Family Medicine

## 2024-05-12 ENCOUNTER — Encounter: Admitting: Physician Assistant

## 2024-05-12 ENCOUNTER — Encounter: Payer: Self-pay | Admitting: Family Medicine

## 2024-05-12 ENCOUNTER — Ambulatory Visit: Admitting: Family Medicine

## 2024-05-12 NOTE — Progress Notes (Signed)
 Inappropriate appt.   Wanting to see his PCP and scheduled incorrectly.  Advised to call PCP office to schedule.  Mark Erroneous. No charge.  This encounter was created in error - please disregard.

## 2024-08-02 ENCOUNTER — Encounter: Admitting: Family Medicine
